# Patient Record
Sex: Male | Born: 1935 | Race: White | Hispanic: No | Marital: Married | Smoking: Former smoker
Health system: Southern US, Community
[De-identification: ages and names within clinical notes are randomized; demographics above are authoritative.]

## PROBLEM LIST (undated history)

## (undated) DIAGNOSIS — K635 Polyp of colon: Secondary | ICD-10-CM

## (undated) DIAGNOSIS — H3530 Unspecified macular degeneration: Secondary | ICD-10-CM

## (undated) DIAGNOSIS — I6529 Occlusion and stenosis of unspecified carotid artery: Secondary | ICD-10-CM

## (undated) DIAGNOSIS — N200 Calculus of kidney: Secondary | ICD-10-CM

## (undated) DIAGNOSIS — G4730 Sleep apnea, unspecified: Secondary | ICD-10-CM

## (undated) DIAGNOSIS — E785 Hyperlipidemia, unspecified: Secondary | ICD-10-CM

## (undated) DIAGNOSIS — N529 Male erectile dysfunction, unspecified: Secondary | ICD-10-CM

## (undated) DIAGNOSIS — M1990 Unspecified osteoarthritis, unspecified site: Secondary | ICD-10-CM

## (undated) DIAGNOSIS — I82409 Acute embolism and thrombosis of unspecified deep veins of unspecified lower extremity: Secondary | ICD-10-CM

## (undated) DIAGNOSIS — N400 Enlarged prostate without lower urinary tract symptoms: Secondary | ICD-10-CM

## (undated) DIAGNOSIS — E669 Obesity, unspecified: Secondary | ICD-10-CM

## (undated) HISTORY — DX: Calculus of kidney: N20.0

## (undated) HISTORY — DX: Acute embolism and thrombosis of unspecified deep veins of unspecified lower extremity: I82.409

## (undated) HISTORY — DX: Obesity, unspecified: E66.9

## (undated) HISTORY — DX: Male erectile dysfunction, unspecified: N52.9

## (undated) HISTORY — DX: Occlusion and stenosis of unspecified carotid artery: I65.29

## (undated) HISTORY — DX: Benign prostatic hyperplasia without lower urinary tract symptoms: N40.0

## (undated) HISTORY — PX: KNEE ARTHROSCOPY, MEDIAL PATELLO FEMORAL LIGAMENT RECONSTRUCTION W/ HAMSTRING GRAFT: SHX1889

## (undated) HISTORY — PX: SPINAL FUSION: SHX223

## (undated) HISTORY — DX: Sleep apnea, unspecified: G47.30

## (undated) HISTORY — DX: Unspecified macular degeneration: H35.30

## (undated) HISTORY — DX: Polyp of colon: K63.5

## (undated) HISTORY — PX: EYE SURGERY: SHX253

---

## 1993-10-24 DIAGNOSIS — I82409 Acute embolism and thrombosis of unspecified deep veins of unspecified lower extremity: Secondary | ICD-10-CM

## 1993-10-24 HISTORY — DX: Acute embolism and thrombosis of unspecified deep veins of unspecified lower extremity: I82.409

## 2000-08-03 ENCOUNTER — Ambulatory Visit (HOSPITAL_COMMUNITY): Admission: RE | Admit: 2000-08-03 | Discharge: 2000-08-03 | Payer: Self-pay | Admitting: Orthopaedic Surgery

## 2002-07-12 ENCOUNTER — Ambulatory Visit (HOSPITAL_BASED_OUTPATIENT_CLINIC_OR_DEPARTMENT_OTHER): Admission: RE | Admit: 2002-07-12 | Discharge: 2002-07-12 | Payer: Self-pay | Admitting: Pulmonary Disease

## 2003-09-11 ENCOUNTER — Encounter (INDEPENDENT_AMBULATORY_CARE_PROVIDER_SITE_OTHER): Payer: Self-pay | Admitting: Specialist

## 2003-09-11 ENCOUNTER — Inpatient Hospital Stay (HOSPITAL_COMMUNITY): Admission: RE | Admit: 2003-09-11 | Discharge: 2003-09-15 | Payer: Self-pay | Admitting: Orthopedic Surgery

## 2004-12-10 ENCOUNTER — Ambulatory Visit (HOSPITAL_COMMUNITY): Admission: RE | Admit: 2004-12-10 | Discharge: 2004-12-10 | Payer: Self-pay | Admitting: Gastroenterology

## 2004-12-10 ENCOUNTER — Encounter (INDEPENDENT_AMBULATORY_CARE_PROVIDER_SITE_OTHER): Payer: Self-pay | Admitting: Specialist

## 2005-03-24 ENCOUNTER — Ambulatory Visit (HOSPITAL_COMMUNITY): Admission: RE | Admit: 2005-03-24 | Discharge: 2005-03-25 | Payer: Self-pay | Admitting: Ophthalmology

## 2006-03-12 ENCOUNTER — Encounter: Admission: RE | Admit: 2006-03-12 | Discharge: 2006-03-12 | Payer: Self-pay | Admitting: Orthopedic Surgery

## 2006-07-06 ENCOUNTER — Inpatient Hospital Stay (HOSPITAL_COMMUNITY): Admission: RE | Admit: 2006-07-06 | Discharge: 2006-07-10 | Payer: Self-pay | Admitting: Orthopedic Surgery

## 2007-04-03 ENCOUNTER — Ambulatory Visit (HOSPITAL_COMMUNITY): Admission: RE | Admit: 2007-04-03 | Discharge: 2007-04-03 | Payer: Self-pay | Admitting: Family Medicine

## 2007-04-03 ENCOUNTER — Ambulatory Visit: Payer: Self-pay | Admitting: Vascular Surgery

## 2007-11-22 ENCOUNTER — Ambulatory Visit (HOSPITAL_COMMUNITY): Admission: RE | Admit: 2007-11-22 | Discharge: 2007-11-23 | Payer: Self-pay | Admitting: Ophthalmology

## 2010-05-10 ENCOUNTER — Emergency Department (HOSPITAL_COMMUNITY): Admission: EM | Admit: 2010-05-10 | Discharge: 2010-05-10 | Payer: Self-pay | Admitting: Emergency Medicine

## 2010-05-18 ENCOUNTER — Encounter: Admission: RE | Admit: 2010-05-18 | Discharge: 2010-05-18 | Payer: Self-pay | Admitting: Urology

## 2010-05-18 ENCOUNTER — Ambulatory Visit (HOSPITAL_COMMUNITY): Admission: RE | Admit: 2010-05-18 | Discharge: 2010-05-18 | Payer: Self-pay | Admitting: Urology

## 2010-10-27 ENCOUNTER — Encounter
Admission: RE | Admit: 2010-10-27 | Discharge: 2010-10-27 | Payer: Self-pay | Source: Home / Self Care | Attending: Family Medicine | Admitting: Family Medicine

## 2011-01-08 LAB — URINALYSIS, ROUTINE W REFLEX MICROSCOPIC
Bilirubin Urine: NEGATIVE
Glucose, UA: NEGATIVE mg/dL
Ketones, ur: NEGATIVE mg/dL
Leukocytes, UA: NEGATIVE
Nitrite: NEGATIVE
Protein, ur: NEGATIVE mg/dL
Specific Gravity, Urine: 1.023 (ref 1.005–1.030)
Urobilinogen, UA: 0.2 mg/dL (ref 0.0–1.0)
pH: 5 (ref 5.0–8.0)

## 2011-01-08 LAB — BASIC METABOLIC PANEL
BUN: 22 mg/dL (ref 6–23)
BUN: 32 mg/dL — ABNORMAL HIGH (ref 6–23)
CO2: 23 mEq/L (ref 19–32)
CO2: 31 mEq/L (ref 19–32)
Calcium: 8.4 mg/dL (ref 8.4–10.5)
Calcium: 8.9 mg/dL (ref 8.4–10.5)
Chloride: 105 mEq/L (ref 96–112)
Chloride: 102 mEq/L (ref 96–112)
Creatinine, Ser: 1.48 mg/dL (ref 0.4–1.5)
Creatinine, Ser: 2.35 mg/dL — ABNORMAL HIGH (ref 0.4–1.5)
GFR calc Af Amer: 57 mL/min — ABNORMAL LOW (ref >60)
GFR calc Af Amer: 33 mL/min — ABNORMAL LOW (ref >60)
GFR calc non Af Amer: 47 mL/min — ABNORMAL LOW (ref >60)
GFR calc non Af Amer: 27 mL/min — ABNORMAL LOW (ref >60)
Glucose, Bld: 175 mg/dL — ABNORMAL HIGH (ref 70–99)
Glucose, Bld: 116 mg/dL — ABNORMAL HIGH (ref 70–99)
Potassium: 3.9 mEq/L (ref 3.5–5.1)
Potassium: 4.7 mEq/L (ref 3.5–5.1)
Sodium: 136 mEq/L (ref 135–145)
Sodium: 140 mEq/L (ref 135–145)

## 2011-01-08 LAB — CBC
HCT: 46 % (ref 39.0–52.0)
Hemoglobin: 15.8 g/dL (ref 13.0–17.0)
MCH: 30.6 pg (ref 26.0–34.0)
MCHC: 34.4 g/dL (ref 30.0–36.0)
MCV: 89.2 fL (ref 78.0–100.0)
Platelets: 110 10*3/uL — ABNORMAL LOW (ref 150–400)
RBC: 5.16 MIL/uL (ref 4.22–5.81)
RDW: 13.2 % (ref 11.5–15.5)
WBC: 10.1 10*3/uL (ref 4.0–10.5)

## 2011-01-08 LAB — DIFFERENTIAL
Basophils Absolute: 0 10*3/uL (ref 0.0–0.1)
Basophils Relative: 0 % (ref 0–1)
Eosinophils Absolute: 0.1 10*3/uL (ref 0.0–0.7)
Eosinophils Relative: 1 % (ref 0–5)
Lymphocytes Relative: 7 % — ABNORMAL LOW (ref 12–46)
Lymphs Abs: 0.7 10*3/uL (ref 0.7–4.0)
Monocytes Absolute: 0.7 10*3/uL (ref 0.1–1.0)
Monocytes Relative: 7 % (ref 3–12)
Neutro Abs: 8.6 10*3/uL — ABNORMAL HIGH (ref 1.7–7.7)
Neutrophils Relative %: 85 % — ABNORMAL HIGH (ref 43–77)

## 2011-01-08 LAB — URINE MICROSCOPIC-ADD ON

## 2011-03-08 NOTE — Op Note (Signed)
[name], Ian Rhodes              ACCOUNT NO.:  [id number]   MEDICAL RECORD NO.:  [id number]          PATIENT TYPE:  AMB   LOCATION:  SDS                          FACILITY:  [facility]   PHYSICIAN:  John D. Zigmund Daniel, M.D. DATE OF BIRTH:  May 16, 1936   DATE OF PROCEDURE:  11/22/2007  DATE OF DISCHARGE:                               OPERATIVE REPORT   ADMISSION DIAGNOSIS:  Recurrent macular hole left eye.   PROCEDURES:  1. Pars plana vitrectomy.  2. Retinal photocoagulation.  3. Membrane peel.  4. Serum patch.  5. Gas fluid exchange in the left eye.   SURGEON:  Chrystie Nose. Zigmund Daniel, M.D.   ASSISTANT:  Deatra Ina, MA   ANESTHESIA:  General.   DETAILS:  Usual prep and drape.  The indirect ophthalmoscope laser was  moved into place, 658 burns were placed around the retinal periphery  with the power of 360 milliwatts, 1000 microns each and 0.1 seconds  each.   Attention was carried to the pars plana area where sclerotomies were  made at 10, 2, and 4 o'clock.  The infusion port anchored into place at  4 o'clock.  The lighted pick and the cutter placed at 10 and 2 o'clock  respectively.  Contact lens ring anchored into place at 6 and 12  o'clock.  Methylcellulose placed on the corneal surface, and the flat  contact lens was placed.  The pars plana vitrectomy was begun just  behind the pseudophakos.  Wisps of vitreous were encountered, and  carefully removed under low suction and rapid cutting down to the  vitreous base for 360 degrees.   Attention was carried to the macular region where the macular hole was  apparent.  Blood was covering the disk area, and this was removed with a  [place] [employer] brush.  The diamond dusted membrane scraper, the Eagle  pick and ILM forceps were used to engage the ILM and remove it from its  attachment to the macular hole.  The 25-gauge cutter was opened to trim  the ILM off of the edge of the macular hole.  Once all traction was  released from the  edge of the hole, a total gas fluid exchange was  performed.  Additional time was allowed to pass, so that fluid tracked  down the walls of the eye, and collected in the posterior segment.  The  [place] [employer] brush was used to dry the retina and remove all  vitreous fluid.  Serum patch was delivered.  Perfluoron propane, 16%  concentration, was exchanged for intravitreal gas.   The instruments were removed from the eye, and 9-0 nylon was used to  close the sclerotomy sites.  They were tested and found to be tight.  The conjunctiva was closed with wet-field cautery.  Polymyxin and  gentamicin were irrigated into tenon's space.  Decadron 10 mg was  injected into the lower subconjunctival space.  Marcaine was injected  around the globe for postop pain.  The closing pressure was 10 with a  Baer keratometer.   COMPLICATIONS:  None.   DURATION:  1 hour.   TobraDex, a  patch and shield were placed.  The patient was awakened and  taken to recovery in satisfactory condition.      Chrystie Nose. Zigmund Daniel, M.D.  Electronically Signed     JDM/MEDQ  D:  11/22/2007  T:  11/22/2007  Job:  [id number]

## 2011-03-11 NOTE — Op Note (Signed)
NAME:  Ian Rhodes, Ian Rhodes NO.:  [id number]   MEDICAL RECORD NO.:  [id number]                   PATIENT TYPE:  INP   LOCATION:  2895                                 FACILITY:  [facility]   PHYSICIAN:  Newt Minion, M.D.                DATE OF BIRTH:  1936-05-07   DATE OF PROCEDURE:  09/11/2003  DATE OF DISCHARGE:                                 OPERATIVE REPORT   PREOPERATIVE DIAGNOSIS:  1. Osteoarthritis of the right knee.  2. Cyst, right posterior shoulder.   POSTOPERATIVE DIAGNOSIS:  1. Osteoarthritis of the right knee.  2. Cyst, right posterior shoulder.   OPERATION PERFORMED:  Right total knee arthroplasty with Osteonics Scorpio  components, #11 femur, #9 tibia, 10 mm poly tray with a #9 patella and  excision of cyst.   SURGEON:  Newt Minion, M.D.   ANESTHESIA:  General.   ESTIMATED BLOOD LOSS:  Minimal.   ANTIBIOTICS:  1g Kefzol.   TOURNIQUET TIME:  101 minutes at 341mmHg.   DISPOSITION:  To post anesthesia care unit in stable condition.   INDICATIONS FOR PROCEDURE:  The patient is a 75 year old gentleman who is  status post open meniscectomies with degenerative collapse of his right knee  and presents at this time for right total knee arthroplasty.  The patient  states he has failed conservative care and wishes to proceed with total knee  replacement at this time.  The risks and benefits were discussed including  infection, neurovascular injury, persistent pain, need for additional  surgery, DVT, pulmonary embolus.  The patient states he understands and  wishes to proceed at this time.   DESCRIPTION OF PROCEDURE:  The patient was brought to the operating room 1  and underwent a general anesthetic.  After adequate level of anesthesia  obtained, the patient's right lower extremity was prepped using DuraPrep and  draped into a sterile field.  An Charlie Pitter was used to cover all exposed skin.  The leg was elevated and tourniquet inflated to  350 mmHg.  The midline  incision was made just medial to midline due to his previous lateral open  incision.  This was carried down through the medial retinaculum.  The joint  was exposed.  Attempted eversion of the patella was unsuccessful due to the  large osteophytic bone spurs of the patella and scarring over the lateral  capsule.  The patellar resurfacing was then performed first to allow the  patella to evert.  This was performed and 10 mm of the patella was removed.  Attention was then focused on the femur.  The drill was made and the  guidewire inserted up the femur for a 5 degree valgus.  12 mm was taken off  the distal femur.  The cutting block was sized for 11 and the Chamfer cuts  were made for the 11 femoral component.  Attention was then focused on the  tibia.  The McHale retractor was placed and the 4 mm stylus was used and an  additional 4 mm was taken off the tibia.  This had a stable balance with the  collateral ligaments and the posterior capsule was then released due to  adhesions and tightness in the posterior capsule.  The Chamfer block was  then used for the femur and the Chamfers were made for the posterior  stabilized femoral component.  The femoral trial and the tibial trial were  placed with a 10 mm posterior stabilized component.  This had stable varus-  valgus.  He was at extension to full extension.  Previous contraction was 10  to 20 degrees of flexion contraction and the patient had flexion to 110  degrees without problem.  The patient's tibial tray was then marked with  rotation and external alignment was checked and the keel cuts were then made  on the tibia.  The patella was again checked and resurfaced and the peg  holes were made for the 9 mm poly.  The wound was irrigated with pulse  lavage.  The cement was mixed and the tibial and femoral component were  cemented in place with a #9 tibial component, #11 femoral component and a 10  mm poly tray.  The  poly tray was placed.  The knee was held in extension,  loose cement was removed and the patellar component was also cemented and  loose cement was removed.  The knee was held in extension while the cement  cured.  Pulse lavage was used during this time and further debridement of  loose cement was removed.  The patient's knee was then placed through a  range of motion. There was subluxation of the patella and a lateral release  was performed.  The patella tracked midline after lateral release. The  tourniquet was deflated after 101 minutes.  Hemostasis was obtained and  closure of the wound was then performed. The capsule was closed using #1  Vicryl.  Subcutaneous tissue was closed using 0 Vicryl, subcu was closed  using 2-0 Vicryl.  The skin was closed using Proximate staples.  The wound  was covered with Adaptic orthopedic sponges, ABD dressing, Webril and a  Coban dressing.  Attention was then focused on the sebaceous cyst on his  right posterior shoulder.  New sterile instruments were used.  New gloves  were also used, and an incision was made horizontally which extended through  his previous incision over the sebaceous cyst in this area.  There was white  cheesy material which came out of the cyst which was consistent with a  recurrent sebaceous cyst.  This was sent to pathology for identification.  The wound was closed with Proximate staples.  The wound was covered with  Adaptic, orthopedic sponges and HypaFix tape.  The patient was transferred  supine, extubated and taken to PACU in stable condition.                                               Newt Minion, M.D.    MVD/MEDQ  D:  09/11/2003  T:  09/12/2003  Job:  [id number]

## 2011-03-11 NOTE — Discharge Summary (Signed)
NAMEAHSIR, SIMA NO.:  [id number]   MEDICAL RECORD NO.:  [id number]          PATIENT TYPE:  INP   LOCATION:  [id number]                         FACILITY:  [facility]   PHYSICIAN:  Newt Minion, MD     DATE OF BIRTH:  12/12/1935   DATE OF ADMISSION:  07/06/2006  DATE OF DISCHARGE:  07/10/2006                                 DISCHARGE SUMMARY   DIAGNOSIS:  Osteoarthritis, left knee.   PROCEDURE:  Left total knee arthroplasty.   DISPOSITION:  Discharged to home in stable condition with home health  physical therapy with follow-up in the office in two weeks.   HISTORY OF PRESENT ILLNESS:  The patient is a 75 year old gentleman with  osteoarthritis of his left knee.  The patient has full failed conservative  care and presents at this time for total knee arthroplasty.  The risks and  benefits of the surgery were discussed.  The patient states he understands  and wishes to proceed.   HOSPITAL COURSE:  The patient's hospital course was essentially  unremarkable.  He underwent a left total knee arthroplasty with DePuy  components on July 06, 2006.  He was started on Kefzol for 24 hours for  infection prophylaxis and was started on Coumadin for DVT prophylaxis.  The  patient progressed well with physical therapy and was felt to be safe for  discharge to home and he was discharged to home in stable condition on  September 17 with [facility] for home health physical therapy,  prescription for Tylox and Coumadin, and follow-up in the office one week  after discharge.      Newt Minion, MD  Electronically Signed     MVD/MEDQ  D:  08/01/2006  T:  08/02/2006  Job:  [id number]

## 2011-03-11 NOTE — Op Note (Signed)
Ian Rhodes, Ian Rhodes              ACCOUNT NO.:  [id number]   MEDICAL RECORD NO.:  [id number]          PATIENT TYPE:  AMB   LOCATION:  ENDO                         FACILITY:  [facility]   PHYSICIAN:  John C. Amedeo Plenty, M.D.    DATE OF BIRTH:  1935-12-02   DATE OF PROCEDURE:  12/10/2004  DATE OF DISCHARGE:                                 OPERATIVE REPORT   PROCEDURE:  Colonoscopy.   INDICATIONS FOR PROCEDURE:  Average-risk colon cancer screening in a 75-year-  old patient with no previous screening.   PROCEDURE:  The patient was placed in the left lateral decubitus position  and placed on the pulse monitor with continuous low-flow oxygen delivered by  nasal cannula.  He was sedated with 60 mcg IV fentanyl and 6 mg IV Versed.  The Olympus video colonoscope was inserted into the rectum and advanced to  the ileocolonic anastomosis that was in the right lower quadrant.  He had  had previous colonic surgery for some sort of obstruction.  The anastomosis  appeared intact, as did the colon distal to it, which appeared to possibly  the ascending colon.  The presumed transverse colon also appeared normal.  At approximately 50 cm in the descending colon, there was an 8-mm sessile  polyp that was removed by snare.  Within the sigmoid colon, there were two  more small sessile polyps that were fulgurated by hot biopsy.  A fourth  polyp at the rectosigmoid junction approximately 6 mm in diameter was also  fulgurated by hot biopsy.  No diverticula or other mucosal abnormalities  were seen.  The rectum appeared normal.  Retroflexed view of the anus revealed no  obvious internal hemorrhoids.  The scope was then withdrawn and the patient returned to the recovery room  in stable condition.  He tolerated the procedure well, and there were no  immediate complications.   IMPRESSION:  1.  Descending sigmoid and rectosigmoid colon polyps.  2.  Evidence of previous proximal colonic surgery with intact  anastomosis.   PLAN:  Await histology to determine method and interval for future colon  screening      JCH/MEDQ  D:  12/10/2004  T:  12/10/2004  Job:  [id number]   cc:   Frann Rider, M.D.  [place]  [place] [address]  Fax: [phone]

## 2011-03-11 NOTE — Op Note (Signed)
Ian Rhodes, Ian Rhodes NO.:  [id number]   MEDICAL RECORD NO.:  [id number]          PATIENT TYPE:  INP   LOCATION:  2899                         FACILITY:  [facility]   PHYSICIAN:  Newt Minion, MD     DATE OF BIRTH:  [birth year]   DATE OF PROCEDURE:  07/06/2006  DATE OF DISCHARGE:                                 OPERATIVE REPORT   PREOPERATIVE DIAGNOSIS:  Osteoarthritis, left knee.   POSTOPERATIVE DIAGNOSIS:  Osteoarthritis, left knee.   PROCEDURE:  Left total knee arthroplasty with DePuy components, rotating  platform with a #4 femur, #5 tibia, 41-mm patella with a 12.5-mm posterior  stabilized poly tray.   SURGEON:  Newt Minion, MD   ANESTHESIA:  General.   ESTIMATED BLOOD LOSS:  Minimal.   ANTIBIOTICS:  1 gram of Kefzol.   DRAINS:  None.   COMPLICATIONS:  None.   TOURNIQUET TIME:  30 minutes at 300 mmHg at the thigh.   DISPOSITION:  To PACU in stable condition.   INDICATIONS FOR PROCEDURE:  The patient is a 75 year old gentleman who  presents with chronic osteoarthritis of the left knee.  Has failed  conservative care, has pain with activities of daily living and wished to  proceed with a total knee arthroplasty.  Risks and benefits were discussed  including infection, neurovascular injury, persistent pain, DVT, pulmonary  embolus.  The patient states he understands and wished proceed at this time.   DESCRIPTION OF PROCEDURE:  The patient is brought to OR room 4 and underwent  general anesthetic.  After adequate level of anesthesia obtained, the  patient's left lower extremity was prepped using DuraPrep and draped into a  sterile field.  A midline incision was made.  This was carried down to a  medial parapatellar retinacular incision.  The patella was everted.  The  starting hole was made in the femur.  The IM guide was used with 5 degrees  valgus and this was set to take 11 mm off the distal femur.  The sizing  block was then used.  This sized  for size 4 and the anterior and posterior  chamfers were made for a size 4 femur.  Attention was then focused on the  tibia.  External alignment guide was used with neutral varus-valgus, neutral  posterior slope and this was set to take 10 mm off the medial femoral  plateau.  This cut was made and then the keel cuts were then made for the  size five tibia.  The box cut was then used on the femur and the box cuts  were made for the femur.  The trial femoral and tibial components were  placed and this was tried with a 10 and 12.5-mm poly tray.  The poly tray  12.5 had full extension and good varus and valgus stability.  The trial  components were removed.  The punch holes were made for the femur and  attention was focused on the patella.  The patella was resurfaced and this  was measured for a size 41 patella and the drill holes were made for  the  patella pegs.  The knee was then irrigated with normal saline.  The meniscal  tissue was excised.  Large bony loose bodies were also excised.  This was  cleansed with normal saline with pulse lavage.  The tibial and femoral  components were then cemented in place.  This was then again irrigated with  normal saline.  Loose cement was removed and the knee was placed in  extension with a 12.5-mm poly tray.  The patella was then cemented in place  and a clamp was left in place.  The knee was left extended until the cement  had hardened.  The pulse lavage was again used to irrigate the knee.  The  clamps were removed and the knee was placed through full range of motion.  The patella tracked midline.  The tourniquet was deflated after total of 30  minutes.  Hemostasis was obtained.  The retinacular incision was closed  using a #1 Vicryl.  Subcu was closed using 2-0 Vicryl.  The skin was closed  using Proximate staples.  The wound was covered with Adaptic orthopedic  sponges, ABD dressing, Webril and a Coban dressing.  The patient was  extubated and taken  to PACU in stable condition.      Newt Minion, MD  Electronically Signed     MVD/MEDQ  D:  07/06/2006  T:  07/07/2006  Job:  [id number]

## 2011-03-11 NOTE — H&P (Signed)
NAME:  Ian Rhodes, Ian Rhodes NO.:  [id number]   MEDICAL RECORD NO.:  [id number]                   PATIENT TYPE:  INP   LOCATION:  2895                                 FACILITY:  [facility]   PHYSICIAN:  Newt Minion, M.D.                DATE OF BIRTH:  [birth year]   DATE OF ADMISSION:  09/11/2003  DATE OF DISCHARGE:                                HISTORY & PHYSICAL   HISTORY OF PRESENT ILLNESS:  The patient is a 75 year old gentleman with  degenerative osteoarthritis of both knees with complaints of stiffness,  decreased flexion, decreased extension, and giving way. The patient has had  chronic knee pain. He is status post arthroscopy of the right knee with an  open procedure in 1995 and presents at this time for total knee  arthroplasty.   ALLERGIES:  No known drug allergies.   MEDICATIONS:  No daily medications.   PAST SURGICAL HISTORY:  1. Positive for herniorrhaphy in 1963.  2. Intestinal surgery in 1968.  3. Arthroscopy in 1970 and 1995.  4. Spinal fusion 1985.  5. Urinary surgery 1992.   PAST MEDICAL HISTORY:  The patient has a history of DVT of the right lower  extremity x2.   SOCIAL HISTORY:  Negative for tobacco since 1995. Negative for alcohol. He  is married.   FAMILY HISTORY:  Noncontributory.   REVIEW OF SYSTEMS:  Positive for arthritis, hernia, history of DVT secondary  to his knee scope.   PHYSICAL EXAMINATION:  VITAL SIGNS:  Temperature 97.6, pulse 60, respiratory  rate 16, blood pressure 124/78.  GENERAL:  In general, he is no acute distress.  LUNGS:  Clear to auscultation.  CARDIOVASCULAR:  Regular rate and rhythm.  NECK:  Supple, no bruits.  EXTREMITIES:  Examination of his posterior right shoulder:  He does have a  large sebaceous cyst. Examination of the right knee:  He has range of motion  from 10 to 120 degrees with neutral varus and valgus alignment. Radiograph  shows tricompartmental osteoarthritis.   ASSESSMENT:   Tricompartmental osteoarthritis, right knee.   PLAN:  The patient is scheduled for right total knee arthroplasty at this  time. The risks and benefits were discussed including infection,  neurovascular injury, persistent pain, decreased range of motion, DVT,  pulmonary embolus. The patient states he understands and wishes to proceed  at this time.                                                Newt Minion, M.D.    MVD/MEDQ  D:  09/11/2003  T:  09/11/2003  Job:  [id number]

## 2011-03-11 NOTE — Op Note (Signed)
NAMEGAYLAND, SIVERLING              ACCOUNT NO.:  [id number]   MEDICAL RECORD NO.:  [id number]          PATIENT TYPE:  OIB   LOCATION:  5729                         FACILITY:  [facility]   PHYSICIAN:  Chrystie Nose. Zigmund Daniel, M.D. DATE OF BIRTH:  [birth year]   DATE OF PROCEDURE:  03/24/2005  DATE OF DISCHARGE:                                 OPERATIVE REPORT   ADMISSION DIAGNOSIS:  Macular macular hole in the left eye.   PROCEDURES:  Retinal photocoagulation, left eye; pars plana vitrectomy, left  eye; membrane peel, left eye; serum patch, left eye; perfluoropropane  injection, left eye.   SURGEON:  Tempie Hoist, MD   ASSISTANT:  Deatra Ina, MA   ANESTHESIA:  General.   DETAILS:  Usual prep and drape, peritomies at 10, 2 and 4, infusion at 4  o'clock.  The lighted pick and the cutter were placed at 10 and 2 o'clock  respectively, contact lens ring anchored into place at 6 and 12 o'clock.  The methylcellulose was placed on the cornea and the flat contact lens was  placed.  The pars plana vitrectomy was begun just behind the crystalline  lens where strands of vitreous were encountered; these were carefully  removed under low suction and rapid cutting.  The vitrectomy was carried  down to the macular surface where additional membranes were encountered.  The silicone-tipped suction line was drawn down to the macular surface and a  fish strike sign occurred twice.  The posterior membranes were stripped up  with a lighted pick and the vitreous cutter, and the silicone-tipped suction  line.  These were removed from with the reinsertion of the vitreous cutter  and vitrectomy removal.  Once all membranes were removed, the indirect  ophthalmoscope was moved into place ; 797 burns were placed around the  retinal periphery with a power between 300 and 600 milliwatts, 1000 microns  each and 0.1 seconds each.  The diamond-dusted membrane scraper was used to  remove membranes from around the edge of the  macular hole.  The Alleman brush was then used for a gas-fluid exchange.  A total gas fill  of the vitreous cavity was obtained.  Sufficient time was allowed for  additional fluid to track down the walls of the eye and collect in the  posterior pole; during this time, the perfluoropropane was mixed to 17% and  the serum patch was prepared.  Additional fluid was removed with a [place]  [employer] brush, a serum patch was delivered, the perfluoropropane was  exchanged for intravitreal gas, the instruments were removed from the eye  and 9-0 nylon was used to close the sclerotomy sites.  The conjunctiva was  closed with wet-field cautery.  Polymyxin and gentamicin were irrigated into  tenon space,  Decadron 10 mg was injected to the lower subconjunctival  space.  Marcaine was injected around the globe for postop pain.  Additional  perfluoropropane gas was injected to a closing pressure of 15 mmHg with a  Baer keratometer.  The wet-field cautery was used to close the conjunctiva.  TobraDex ophthalmic ointment, a patch and shield  were placed.  The patient  was awakened and taken to Recovery in satisfactory condition.   COMPLICATIONS:  None.   DURATION:  One and a half hours.      JDM/MEDQ  D:  03/24/2005  T:  03/25/2005  Job:  [id number]

## 2011-03-11 NOTE — Discharge Summary (Signed)
NAME:  Ian Rhodes, KUSNER NO.:  [id number]   MEDICAL RECORD NO.:  [id number]                   PATIENT TYPE:  INP   LOCATION:  5006                                 FACILITY:  [facility]   PHYSICIAN:  Newt Minion, M.D.                DATE OF BIRTH:  18-May-1936   DATE OF ADMISSION:  09/11/2003  DATE OF DISCHARGE:  09/15/2003                                 DISCHARGE SUMMARY   DIAGNOSIS:  Osteoarthritis right hip.   PROCEDURE:  Right total hip arthroplasty.   DISPOSITION:  Discharged to home in stable condition with Home Health  physical therapy and home CPM.   FOLLOWUP:  In the office in two weeks.   HISTORY OF PRESENT ILLNESS:  The patient is a 75 year old gentleman with  bilateral knee osteoarthritis.  The patient has progressed to the point  where he is unable to perform activities of daily living due to his right  knee pain.  He presents at this time after failing conservative care for  right total hip arthroplasty.   HOSPITAL COURSE:  Essentially unremarkable.  He underwent a right total knee  arthroplasty on November 18, with Osteonic Scorpio components, cemented #11  femur, #9 tibia, 10 mm poly tray with a #9 patella.  The patient also had a  cyst mid scapular border, and this was also excised.  The patient was  treated with Kefzol for infection prophylaxis, and Coumadin for DVT  prophylaxis.  The patient progressed well with physical therapy.  He  continued to have stiffness in his knee and required continued CPM for range  of motion of his knee.  His vital signs were stable.  He was discharged to  home in stable condition on November 22 with Coumadin for continued DVT  prophylaxis, Vicodin for pain, Home Health physical therapy and CPM.   FOLLOWUP:  He will follow up in the office in two weeks.                                                Newt Minion, M.D.    MVD/MEDQ  D:  10/17/2003  T:  10/19/2003  Job:  [id number]

## 2011-07-14 LAB — CBC
HCT: 44.3
Hemoglobin: 14.8
MCHC: 33.3
MCV: 85.8
Platelets: 143 — ABNORMAL LOW
RBC: 5.17
RDW: 14.5
WBC: 5.4

## 2011-07-14 LAB — BASIC METABOLIC PANEL
BUN: 14
CO2: 30
Calcium: 8.9
Chloride: 105
Creatinine, Ser: 1.21
GFR calc Af Amer: >60
GFR calc non Af Amer: 59 — ABNORMAL LOW
Glucose, Bld: 123 — ABNORMAL HIGH
Potassium: 4.3
Sodium: 142

## 2011-07-14 LAB — AUTOLOGOUS SERUM PATCH PREP

## 2011-09-05 ENCOUNTER — Other Ambulatory Visit (HOSPITAL_COMMUNITY): Payer: Self-pay | Admitting: Orthopedic Surgery

## 2011-09-14 ENCOUNTER — Encounter (HOSPITAL_COMMUNITY)
Admission: RE | Admit: 2011-09-14 | Discharge: 2011-09-14 | Disposition: A | Discharge status: Home / Self Care | Payer: Medicare Other | Source: Ambulatory Visit | Attending: Orthopedic Surgery | Admitting: Orthopedic Surgery

## 2011-09-14 ENCOUNTER — Other Ambulatory Visit: Payer: Self-pay

## 2011-09-14 ENCOUNTER — Encounter (HOSPITAL_COMMUNITY): Payer: Self-pay

## 2011-09-14 HISTORY — DX: Hyperlipidemia, unspecified: E78.5

## 2011-09-14 LAB — SURGICAL PCR SCREEN
MRSA, PCR: NEGATIVE
Staphylococcus aureus: POSITIVE — AB

## 2011-09-14 LAB — COMPREHENSIVE METABOLIC PANEL
ALT: 18 U/L (ref 0–53)
Albumin: 4.1 g/dL (ref 3.5–5.2)
Alkaline Phosphatase: 46 U/L (ref 39–117)
Calcium: 9.5 mg/dL (ref 8.4–10.5)
Potassium: 4.9 mEq/L (ref 3.5–5.1)
Sodium: 139 mEq/L (ref 135–145)
Total Protein: 6.6 g/dL (ref 6.0–8.3)

## 2011-09-14 LAB — APTT: aPTT: 37 seconds (ref 24–37)

## 2011-09-14 LAB — CBC
MCHC: 34.6 g/dL (ref 30.0–36.0)
RDW: 14 % (ref 11.5–15.5)

## 2011-09-14 LAB — PROTIME-INR: Prothrombin Time: 12.9 seconds (ref 11.6–15.2)

## 2011-09-14 NOTE — Pre-Procedure Instructions (Signed)
[place] III  09/14/2011   Your procedure is scheduled on:  Nov. 28, 2012  Report to [place] at [place].  Call this number if you have problems the morning of surgery: [phone]   Remember:   Do not eat food:4 Hours before arrival.  Do not drink clear liquids: 4 Hours before arrival.  Take these medicines the morning of surgery with A SIP OF WATER: none   Do not wear jewelry, make-up or nail polish.  Do not wear lotions, powders, or perfumes. You may wear deodorant.  Do not shave 48 hours prior to surgery.  Do not bring valuables to the hospital.  Contacts, dentures or bridgework may not be worn into surgery.  Leave suitcase in the car. After surgery it may be brought to your room.  For patients admitted to the hospital, checkout time is 11:00 AM the day of discharge.   Patients discharged the day of surgery will not be allowed to drive home.  Name and phone number of your driver: na  Special Instructions: CHG Shower Use Special Wash: 1/2 bottle night before surgery and 1/2 bottle morning of surgery.   Please read over the following fact sheets that you were given: Pain Booklet, Blood Transfusion Information and Surgical Site Infection Prevention

## 2011-09-14 NOTE — Pre-Procedure Instructions (Signed)
[place] III  09/14/2011   Your procedure is scheduled on Sep 21, 2011 Report to [place] at [place].  Call this number if you have problems the morning of surgery: [phone]   Remember:   Do not eat food:After Midnight.  Do not drink clear liquids: 4 Hours before arrival.  Take these medicines the morning of surgery with A SIP OF WATER: none   Do not wear jewelry, make-up or nail polish.  Do not wear lotions, powders, or perfumes. You may wear deodorant.  Do not shave 48 hours prior to surgery.  Do not bring valuables to the hospital.  Contacts, dentures or bridgework may not be worn into surgery.  Leave suitcase in the car. After surgery it may be brought to your room.  For patients admitted to the hospital, checkout time is 11:00 AM the day of discharge.   Patients discharged the day of surgery will not be allowed to drive home.  Name and phone number of your driver: na  Special Instructions: CHG Shower Use Special Wash: 1/2 bottle night before surgery and 1/2 bottle morning of surgery.   Please read over the following fact sheets that you were given: Pain Booklet, Blood Transfusion Information and Surgical Site Infection Prevention

## 2011-09-20 MED ORDER — CEFAZOLIN SODIUM-DEXTROSE 2-3 GM-% IV SOLR
2.0000 g | INTRAVENOUS | Status: AC
  Administered 2011-09-21: 2 g via INTRAVENOUS
  Filled 2011-09-20: qty 50

## 2011-09-21 ENCOUNTER — Inpatient Hospital Stay (HOSPITAL_COMMUNITY): Payer: Medicare Other | Admitting: Anesthesiology

## 2011-09-21 ENCOUNTER — Encounter (HOSPITAL_COMMUNITY): Payer: Self-pay | Admitting: Anesthesiology

## 2011-09-21 ENCOUNTER — Inpatient Hospital Stay (HOSPITAL_COMMUNITY)
Admission: RE | Admit: 2011-09-21 | Discharge: 2011-09-24 | DRG: 470 | Disposition: A | Discharge status: Home Health | Payer: Medicare Other | Source: Ambulatory Visit | Attending: Orthopedic Surgery | Admitting: Orthopedic Surgery

## 2011-09-21 ENCOUNTER — Encounter (HOSPITAL_COMMUNITY)
Admission: RE | Disposition: A | Discharge status: Home Health | Payer: Self-pay | Source: Ambulatory Visit | Attending: Orthopedic Surgery

## 2011-09-21 ENCOUNTER — Encounter (HOSPITAL_COMMUNITY): Payer: Self-pay | Admitting: Surgery

## 2011-09-21 ENCOUNTER — Inpatient Hospital Stay (HOSPITAL_COMMUNITY): Payer: Medicare Other

## 2011-09-21 DIAGNOSIS — Z7982 Long term (current) use of aspirin: Secondary | ICD-10-CM

## 2011-09-21 DIAGNOSIS — E785 Hyperlipidemia, unspecified: Secondary | ICD-10-CM | POA: Diagnosis present

## 2011-09-21 DIAGNOSIS — E119 Type 2 diabetes mellitus without complications: Secondary | ICD-10-CM | POA: Diagnosis present

## 2011-09-21 DIAGNOSIS — Z87891 Personal history of nicotine dependence: Secondary | ICD-10-CM

## 2011-09-21 DIAGNOSIS — Z96649 Presence of unspecified artificial hip joint: Secondary | ICD-10-CM

## 2011-09-21 DIAGNOSIS — Z7901 Long term (current) use of anticoagulants: Secondary | ICD-10-CM

## 2011-09-21 DIAGNOSIS — Z981 Arthrodesis status: Secondary | ICD-10-CM

## 2011-09-21 DIAGNOSIS — Z01812 Encounter for preprocedural laboratory examination: Secondary | ICD-10-CM

## 2011-09-21 DIAGNOSIS — M169 Osteoarthritis of hip, unspecified: Principal | ICD-10-CM | POA: Diagnosis present

## 2011-09-21 DIAGNOSIS — Z79899 Other long term (current) drug therapy: Secondary | ICD-10-CM

## 2011-09-21 DIAGNOSIS — M1610 Unilateral primary osteoarthritis, unspecified hip: Principal | ICD-10-CM | POA: Diagnosis present

## 2011-09-21 HISTORY — PX: TOTAL HIP ARTHROPLASTY: SHX124

## 2011-09-21 LAB — TYPE AND SCREEN: ABO/RH(D): A POS

## 2011-09-21 LAB — GLUCOSE, CAPILLARY
Glucose-Capillary: 138 mg/dL — ABNORMAL HIGH (ref 70–99)
Glucose-Capillary: 135 mg/dL — ABNORMAL HIGH (ref 70–99)

## 2011-09-21 LAB — ABO/RH: ABO/RH(D): A POS

## 2011-09-21 SURGERY — ARTHROPLASTY, HIP, TOTAL,POSTERIOR APPROACH
Anesthesia: General | Site: Hip | Laterality: Right | Wound class: Clean

## 2011-09-21 MED ORDER — FERROUS SULFATE 325 (65 FE) MG PO TABS
325.0000 mg | ORAL_TABLET | Freq: Three times a day (TID) | ORAL | Status: DC
  Administered 2011-09-21 – 2011-09-24 (×8): 325 mg via ORAL
  Filled 2011-09-21 (×13): qty 1

## 2011-09-21 MED ORDER — OXYCODONE-ACETAMINOPHEN 5-325 MG PO TABS
1.0000 | ORAL_TABLET | ORAL | Status: DC | PRN
  Administered 2011-09-22 – 2011-09-23 (×6): 2 via ORAL
  Filled 2011-09-21 (×4): qty 2
  Filled 2011-09-21: qty 1
  Filled 2011-09-21 (×2): qty 2

## 2011-09-21 MED ORDER — THERA M PLUS PO TABS
1.0000 | ORAL_TABLET | Freq: Every day | ORAL | Status: DC
  Administered 2011-09-21 – 2011-09-24 (×4): 1 via ORAL
  Filled 2011-09-21 (×4): qty 1

## 2011-09-21 MED ORDER — HETASTARCH-ELECTROLYTES 6 % IV SOLN: INTRAVENOUS | Status: DC

## 2011-09-21 MED ORDER — DIPHENHYDRAMINE HCL 12.5 MG/5ML PO ELIX
12.5000 mg | ORAL_SOLUTION | ORAL | Status: DC | PRN
  Filled 2011-09-21: qty 10

## 2011-09-21 MED ORDER — LACTATED RINGERS IV SOLN
1000.0000 mL | INTRAVENOUS | Status: DC
  Administered 2011-09-21: 08:00:00 via INTRAVENOUS

## 2011-09-21 MED ORDER — SIMVASTATIN 20 MG PO TABS
20.0000 mg | ORAL_TABLET | Freq: Every day | ORAL | Status: DC
  Administered 2011-09-21 – 2011-09-23 (×3): 20 mg via ORAL
  Filled 2011-09-21 (×4): qty 1

## 2011-09-21 MED ORDER — WARFARIN VIDEO: Freq: Once | Status: DC

## 2011-09-21 MED ORDER — MAGNESIUM HYDROXIDE 400 MG/5ML PO SUSP
30.0000 mL | Freq: Two times a day (BID) | ORAL | Status: DC | PRN
  Filled 2011-09-21: qty 30

## 2011-09-21 MED ORDER — SODIUM CHLORIDE 0.9 % IV SOLN
INTRAVENOUS | Status: DC
  Administered 2011-09-21: 30 mL/h via INTRAVENOUS

## 2011-09-21 MED ORDER — ACETAMINOPHEN 650 MG RE SUPP: 650.0000 mg | Freq: Four times a day (QID) | RECTAL | Status: DC | PRN

## 2011-09-21 MED ORDER — ASPIRIN 81 MG PO CHEW
81.0000 mg | CHEWABLE_TABLET | Freq: Every day | ORAL | Status: DC
  Administered 2011-09-21 – 2011-09-24 (×4): 81 mg via ORAL
  Filled 2011-09-21 (×4): qty 1

## 2011-09-21 MED ORDER — PATIENT'S GUIDE TO USING COUMADIN BOOK
Freq: Once | Status: AC
  Administered 2011-09-21: 15:00:00
  Filled 2011-09-21: qty 1

## 2011-09-21 MED ORDER — ACETAMINOPHEN 10 MG/ML IV SOLN
INTRAVENOUS | Status: AC
  Filled 2011-09-21: qty 100

## 2011-09-21 MED ORDER — ONDANSETRON HCL 4 MG/2ML IJ SOLN: 4.0000 mg | Freq: Four times a day (QID) | INTRAMUSCULAR | Status: DC | PRN

## 2011-09-21 MED ORDER — ACETAMINOPHEN 10 MG/ML IV SOLN
1000.0000 mg | Freq: Four times a day (QID) | INTRAVENOUS | Status: DC
  Administered 2011-09-21: 1000 mg via INTRAVENOUS
  Filled 2011-09-21 (×3): qty 100

## 2011-09-21 MED ORDER — WARFARIN SODIUM 7.5 MG PO TABS
7.5000 mg | ORAL_TABLET | Freq: Once | ORAL | Status: AC
  Administered 2011-09-21: 7.5 mg via ORAL
  Filled 2011-09-21: qty 1

## 2011-09-21 MED ORDER — POLYETHYLENE GLYCOL 3350 17 G PO PACK
17.0000 g | PACK | Freq: Every day | ORAL | Status: DC | PRN
  Filled 2011-09-21: qty 1

## 2011-09-21 MED ORDER — DEXAMETHASONE SODIUM PHOSPHATE 4 MG/ML IJ SOLN
INTRAMUSCULAR | Status: DC | PRN
  Administered 2011-09-21: 4 mg via INTRAVENOUS

## 2011-09-21 MED ORDER — DOCUSATE SODIUM 100 MG PO CAPS
100.0000 mg | ORAL_CAPSULE | Freq: Two times a day (BID) | ORAL | Status: DC
  Administered 2011-09-21 – 2011-09-24 (×7): 100 mg via ORAL
  Filled 2011-09-21 (×8): qty 1

## 2011-09-21 MED ORDER — TEMAZEPAM 15 MG PO CAPS: 15.0000 mg | ORAL_CAPSULE | Freq: Every evening | ORAL | Status: DC | PRN

## 2011-09-21 MED ORDER — METHOCARBAMOL 500 MG PO TABS
500.0000 mg | ORAL_TABLET | Freq: Four times a day (QID) | ORAL | Status: DC | PRN
  Administered 2011-09-22 – 2011-09-23 (×3): 500 mg via ORAL
  Filled 2011-09-21 (×4): qty 1

## 2011-09-21 MED ORDER — FLEET ENEMA 7-19 GM/118ML RE ENEM: 1.0000 | ENEMA | Freq: Every day | RECTAL | Status: DC | PRN

## 2011-09-21 MED ORDER — ROCURONIUM BROMIDE 100 MG/10ML IV SOLN
INTRAVENOUS | Status: DC | PRN
  Administered 2011-09-21: 50 mg via INTRAVENOUS
  Administered 2011-09-21: 10 mg via INTRAVENOUS

## 2011-09-21 MED ORDER — MIDAZOLAM HCL 5 MG/5ML IJ SOLN
INTRAMUSCULAR | Status: DC | PRN
  Administered 2011-09-21: 2 mg via INTRAVENOUS

## 2011-09-21 MED ORDER — GLYCOPYRROLATE 0.2 MG/ML IJ SOLN
INTRAMUSCULAR | Status: DC | PRN
  Administered 2011-09-21: .6 mg via INTRAVENOUS

## 2011-09-21 MED ORDER — HYDROMORPHONE HCL PF 1 MG/ML IJ SOLN
0.2500 mg | INTRAMUSCULAR | Status: DC | PRN
  Administered 2011-09-21 (×4): 0.25 mg via INTRAVENOUS

## 2011-09-21 MED ORDER — BISACODYL 10 MG RE SUPP
10.0000 mg | Freq: Every day | RECTAL | Status: DC | PRN
  Administered 2011-09-23: 10 mg via RECTAL
  Filled 2011-09-21: qty 1

## 2011-09-21 MED ORDER — METOCLOPRAMIDE HCL 5 MG/ML IJ SOLN
5.0000 mg | Freq: Three times a day (TID) | INTRAMUSCULAR | Status: DC | PRN
  Filled 2011-09-21: qty 2

## 2011-09-21 MED ORDER — ONDANSETRON HCL 4 MG PO TABS: 4.0000 mg | ORAL_TABLET | Freq: Four times a day (QID) | ORAL | Status: DC | PRN

## 2011-09-21 MED ORDER — PHENOL 1.4 % MT LIQD
1.0000 | OROMUCOSAL | Status: DC | PRN
  Filled 2011-09-21: qty 177

## 2011-09-21 MED ORDER — MENTHOL 3 MG MT LOZG: 1.0000 | LOZENGE | OROMUCOSAL | Status: DC | PRN

## 2011-09-21 MED ORDER — METOCLOPRAMIDE HCL 10 MG PO TABS
5.0000 mg | ORAL_TABLET | Freq: Three times a day (TID) | ORAL | Status: DC | PRN
  Administered 2011-09-23: 10 mg via ORAL
  Filled 2011-09-21: qty 1

## 2011-09-21 MED ORDER — ALUM & MAG HYDROXIDE-SIMETH 200-200-20 MG/5ML PO SUSP: 30.0000 mL | ORAL | Status: DC | PRN

## 2011-09-21 MED ORDER — PROPOFOL 10 MG/ML IV EMUL
INTRAVENOUS | Status: DC | PRN
  Administered 2011-09-21: 200 mL via INTRAVENOUS

## 2011-09-21 MED ORDER — HETASTARCH-ELECTROLYTES 6 % IV SOLN
INTRAVENOUS | Status: DC | PRN
  Administered 2011-09-21: 09:00:00 via INTRAVENOUS

## 2011-09-21 MED ORDER — ZOLPIDEM TARTRATE 5 MG PO TABS: 5.0000 mg | ORAL_TABLET | Freq: Every evening | ORAL | Status: DC | PRN

## 2011-09-21 MED ORDER — FENTANYL CITRATE 0.05 MG/ML IJ SOLN
INTRAMUSCULAR | Status: DC | PRN
  Administered 2011-09-21: 100 ug via INTRAVENOUS
  Administered 2011-09-21: 50 ug via INTRAVENOUS
  Administered 2011-09-21: 100 ug via INTRAVENOUS
  Administered 2011-09-21 (×3): 50 ug via INTRAVENOUS

## 2011-09-21 MED ORDER — HYDROCODONE-ACETAMINOPHEN 5-325 MG PO TABS
1.0000 | ORAL_TABLET | ORAL | Status: DC | PRN
  Administered 2011-09-23: 2 via ORAL
  Administered 2011-09-24: 1 via ORAL
  Filled 2011-09-21: qty 1
  Filled 2011-09-21: qty 2

## 2011-09-21 MED ORDER — ONDANSETRON HCL 4 MG/2ML IJ SOLN: 4.0000 mg | Freq: Once | INTRAMUSCULAR | Status: DC | PRN

## 2011-09-21 MED ORDER — ACETAMINOPHEN 325 MG PO TABS: 650.0000 mg | ORAL_TABLET | Freq: Four times a day (QID) | ORAL | Status: DC | PRN

## 2011-09-21 MED ORDER — SODIUM CHLORIDE 0.9 % IR SOLN
Status: DC | PRN
  Administered 2011-09-21: 1000 mL

## 2011-09-21 MED ORDER — BISACODYL 5 MG PO TBEC
10.0000 mg | DELAYED_RELEASE_TABLET | Freq: Every day | ORAL | Status: DC | PRN
  Administered 2011-09-23: 10 mg via ORAL
  Filled 2011-09-21: qty 2

## 2011-09-21 MED ORDER — ONDANSETRON HCL 4 MG/2ML IJ SOLN
INTRAMUSCULAR | Status: DC | PRN
  Administered 2011-09-21: 4 mg via INTRAVENOUS

## 2011-09-21 MED ORDER — METFORMIN HCL 500 MG PO TABS
1000.0000 mg | ORAL_TABLET | Freq: Two times a day (BID) | ORAL | Status: DC
  Administered 2011-09-21 – 2011-09-24 (×6): 1000 mg via ORAL
  Filled 2011-09-21 (×8): qty 2

## 2011-09-21 MED ORDER — NEOSTIGMINE METHYLSULFATE 1 MG/ML IJ SOLN
INTRAMUSCULAR | Status: DC | PRN
  Administered 2011-09-21: 4 mg via INTRAVENOUS

## 2011-09-21 MED ORDER — HYDROMORPHONE HCL PF 1 MG/ML IJ SOLN
0.5000 mg | INTRAMUSCULAR | Status: DC | PRN
  Administered 2011-09-21 (×2): 1 mg via INTRAVENOUS
  Filled 2011-09-21 (×2): qty 1

## 2011-09-21 MED ORDER — METHOCARBAMOL 100 MG/ML IJ SOLN
500.0000 mg | Freq: Four times a day (QID) | INTRAVENOUS | Status: DC | PRN
  Administered 2011-09-21: 500 mg via INTRAVENOUS
  Filled 2011-09-21 (×2): qty 5

## 2011-09-21 MED ORDER — PHENYLEPHRINE HCL 10 MG/ML IJ SOLN
INTRAMUSCULAR | Status: DC | PRN
  Administered 2011-09-21 (×4): 40 ug via INTRAVENOUS

## 2011-09-21 MED ORDER — INSULIN ASPART 100 UNIT/ML ~~LOC~~ SOLN
0.0000 [IU] | Freq: Three times a day (TID) | SUBCUTANEOUS | Status: DC
  Administered 2011-09-21 – 2011-09-22 (×2): 2 [IU] via SUBCUTANEOUS
  Administered 2011-09-23 (×2): 3 [IU] via SUBCUTANEOUS
  Administered 2011-09-23 – 2011-09-24 (×2): 2 [IU] via SUBCUTANEOUS
  Filled 2011-09-21: qty 3

## 2011-09-21 MED ORDER — CEFAZOLIN SODIUM 1-5 GM-% IV SOLN
1.0000 g | Freq: Four times a day (QID) | INTRAVENOUS | Status: AC
  Administered 2011-09-21 – 2011-09-22 (×3): 1 g via INTRAVENOUS
  Filled 2011-09-21 (×3): qty 50

## 2011-09-21 SURGICAL SUPPLY — 45 items
BLADE SAW SAG 73X25 THK (BLADE) ×1
BLADE SAW SGTL 73X25 THK (BLADE) ×1 IMPLANT
BLADE SURG 10 STRL SS (BLADE) ×1 IMPLANT
BLADE SURG 21 STRL SS (BLADE) ×1 IMPLANT
BRUSH FEMORAL CANAL (MISCELLANEOUS) IMPLANT
CAP HI DEMAND HIP ×1 IMPLANT
CLOTH BEACON ORANGE TIMEOUT ST (SAFETY) ×2 IMPLANT
COVER BACK TABLE 24X17X13 BIG (DRAPES) ×1 IMPLANT
COVER LIGHT HANDLE  DEROYL (MISCELLANEOUS) ×3 IMPLANT
DRAPE INCISE IOBAN 85X60 (DRAPES) ×2 IMPLANT
DRAPE ORTHO SPLIT 77X108 STRL (DRAPES) ×4
DRAPE SURG ORHT 6 SPLT 77X108 (DRAPES) ×2 IMPLANT
DRAPE U-SHAPE 47X51 STRL (DRAPES) ×2 IMPLANT
DRSG MEPILEX BORDER 4X12 (GAUZE/BANDAGES/DRESSINGS) ×1 IMPLANT
DRSG MEPILEX BORDER 4X8 (GAUZE/BANDAGES/DRESSINGS) IMPLANT
DURAPREP 26ML APPLICATOR (WOUND CARE) ×2 IMPLANT
ELECT BLADE 6.5 EXT (BLADE) IMPLANT
ELECT CAUTERY BLADE 6.4 (BLADE) ×2 IMPLANT
ELECT REM PT RETURN 9FT ADLT (ELECTROSURGICAL) ×2
ELECTRODE REM PT RTRN 9FT ADLT (ELECTROSURGICAL) ×1 IMPLANT
GLOVE BIOGEL PI IND STRL 9 (GLOVE) ×1 IMPLANT
GLOVE BIOGEL PI INDICATOR 9 (GLOVE) ×3
GLOVE SURG ORTHO 9.0 STRL STRW (GLOVE) ×4 IMPLANT
GOWN PREVENTION PLUS XLARGE (GOWN DISPOSABLE) ×3 IMPLANT
GOWN SRG XL XLNG 56XLVL 4 (GOWN DISPOSABLE) ×1 IMPLANT
GOWN STRL NON-REIN XL XLG LVL4 (GOWN DISPOSABLE) ×2
HANDPIECE INTERPULSE COAX TIP (DISPOSABLE)
KIT BASIN OR (CUSTOM PROCEDURE TRAY) ×2 IMPLANT
KIT ROOM TURNOVER OR (KITS) ×2 IMPLANT
MANIFOLD NEPTUNE II (INSTRUMENTS) ×2 IMPLANT
NS IRRIG 1000ML POUR BTL (IV SOLUTION) ×2 IMPLANT
PACK TOTAL JOINT (CUSTOM PROCEDURE TRAY) ×2 IMPLANT
PAD ARMBOARD 7.5X6 YLW CONV (MISCELLANEOUS) ×3 IMPLANT
PRESSURIZER FEMORAL UNIV (MISCELLANEOUS) IMPLANT
SET HNDPC FAN SPRY TIP SCT (DISPOSABLE) IMPLANT
STAPLER VISISTAT 35W (STAPLE) ×2 IMPLANT
SUT ETHIBOND NAB CT1 #1 30IN (SUTURE) ×3 IMPLANT
SUT VIC AB 0 CT1 27 (SUTURE) ×4
SUT VIC AB 0 CT1 27XBRD ANBCTR (SUTURE) ×2 IMPLANT
SUT VIC AB 2-0 CTB1 (SUTURE) ×4 IMPLANT
TOWEL OR 17X24 6PK STRL BLUE (TOWEL DISPOSABLE) ×2 IMPLANT
TOWEL OR 17X26 10 PK STRL BLUE (TOWEL DISPOSABLE) ×2 IMPLANT
TOWER CARTRIDGE SMART MIX (DISPOSABLE) IMPLANT
TRAY FOLEY CATH 14FR (SET/KITS/TRAYS/PACK) IMPLANT
WATER STERILE IRR 1000ML POUR (IV SOLUTION) ×5 IMPLANT

## 2011-09-21 NOTE — Progress Notes (Signed)
Noted abnormal EKG.  No past medical hx of heart issues and no past EKG on chart.  OR notified to notify anesthesia prior to procedure.

## 2011-09-21 NOTE — Progress Notes (Signed)
ANTICOAGULATION CONSULT NOTE - Initial Consult  Pharmacy Consult for Coumadin  Indication: VTE prophylaxis   Medical History: Past Medical History  Diagnosis Date  . Diabetes mellitus   . Hyperlipemia     Assessment: 36 YOM s/p R total hip arthroplasty to start coumadin for VTE prophylaxis. Baseline INR 0.95 on 11/21  Goal of Therapy:  INR 2-3   Plan:  1) Coumadin 7.5 mg PO x1 tonight 2) f/u daily INR  Manley Mason 09/21/2011,1:42 PM

## 2011-09-21 NOTE — Anesthesia Postprocedure Evaluation (Signed)
  Anesthesia Post-op Note  Patient: Ian Rhodes  Procedure(s) Performed:  TOTAL HIP ARTHROPLASTY - Right Total Hip Arthroplasty  Patient Location: PACU  Anesthesia Type: General  Level of Consciousness: awake, alert  and oriented  Airway and Oxygen Therapy: Patient Spontanous Breathing and Patient connected to nasal cannula oxygen  Post-op Pain: mild  Post-op Assessment: Post-op Vital signs reviewed and Patient's Cardiovascular Status Stable  Post-op Vital Signs: stable  Complications: No apparent anesthesia complications

## 2011-09-21 NOTE — Anesthesia Preprocedure Evaluation (Addendum)
Anesthesia Evaluation   Patient awake    Reviewed: Allergy & Precautions, H&P , NPO status , Patient's Chart, lab work & pertinent test results, reviewed documented beta blocker date and time   Airway Mallampati: I TM Distance: >3 FB Neck ROM: Full    Dental  (+) Teeth Intact   Pulmonary  clear to auscultation        Cardiovascular Regular Normal    Neuro/Psych    GI/Hepatic   Endo/Other  Diabetes mellitus-, Well Controlled, Type 2, Oral Hypoglycemic Agents  Renal/GU      Musculoskeletal   Abdominal (+) obese,  Abdomen: soft.    Peds  Hematology   Anesthesia Other Findings   Reproductive/Obstetrics                          Anesthesia Physical Anesthesia Plan  ASA: III  Anesthesia Plan: General   Post-op Pain Management:    Induction: Intravenous  Airway Management Planned: Oral ETT  Additional Equipment:   Intra-op Plan:   Post-operative Plan: Extubation in OR  Informed Consent: I have reviewed the patients History and Physical, chart, labs and discussed the procedure including the risks, benefits and alternatives for the proposed anesthesia with the patient or authorized representative who has indicated his/her understanding and acceptance.   Dental advisory given  Plan Discussed with: CRNA and Surgeon  Anesthesia Plan Comments:         Anesthesia Quick Evaluation

## 2011-09-21 NOTE — Anesthesia Procedure Notes (Signed)
Procedure Name: Intubation Date/Time: 09/21/2011 8:42 AM Performed by: Arnaldo Natal Pre-anesthesia Checklist: Patient identified, Emergency Drugs available, Suction available and Patient being monitored Patient Re-evaluated:Patient Re-evaluated prior to inductionOxygen Delivery Method: Circle System Utilized Preoxygenation: Pre-oxygenation with 100% oxygen Intubation Type: IV induction Ventilation: Mask ventilation without difficulty Laryngoscope Size: Mac and 4 Grade View: Grade II Tube type: Oral Tube size: 8.0 mm Number of attempts: 1 Airway Equipment and Method: stylet Placement Confirmation: ETT inserted through vocal cords under direct vision,  positive ETCO2 and breath sounds checked- equal and bilateral Secured at: 23 cm Tube secured with: Tape Dental Injury: Teeth and Oropharynx as per pre-operative assessment

## 2011-09-21 NOTE — H&P (Signed)
Ian Rhodes is an 75 y.o. male.   Chief Complaint: Right hip pain HPI: Patient has had a chronic history of right hip pain. He has pain with activities of daily living he has failed conservative treatment and presents at this time for total hip arthroplasty. Patient has tried activity modifications anti-inflammatories ambulatory assistive aids all without relief.  Past Medical History  Diagnosis Date  . Diabetes mellitus   . Hyperlipemia     Past Surgical History  Procedure Date  . Spinal fusion   . Knee arthroscopy, medial patello femoral ligament reconstruction w/ hamstring graft     bilateral  . Eye surgery     two surgeries    No family history on file. Social History:  reports that he has quit smoking. He quit smokeless tobacco use about 17 years ago. His alcohol and drug histories not on file.  Allergies: No Known Allergies  Medications Prior to Admission  Medication Dose Route Frequency Provider Last Rate Last Dose  . ceFAZolin (ANCEF) IVPB 2 g/50 mL premix  2 g Intravenous 60 min Pre-Op Newt Minion, MD       Medications Prior to Admission  Medication Sig Dispense Refill  . aspirin 81 MG tablet Take 81 mg by mouth daily.        . metFORMIN (GLUCOPHAGE) 1000 MG tablet Take 1,000 mg by mouth 2 (two) times daily with a meal.        . Multiple Vitamins-Minerals (MULTIVITAMINS THER. W/MINERALS) TABS Take 1 tablet by mouth daily.        . pravastatin (PRAVACHOL) 40 MG tablet Take 80 mg by mouth at bedtime.          No results found for this or any previous visit (from the past 48 hour(s)). No results found.  ROS  There were no vitals taken for this visit. Physical Exam on examination patient does have an abductor lurch. He has about 10 of internal and external rotation of his hip. His right lower extremity is neurovascularly intact.  Assessment/Plan Assessment right hip osteoarthritis. Plan will plan for total hip arthroplasty. Risks and benefits were  discussed including infection neurovascular injury persistent pain DVT pulmonary embolus dislocation. Patient states he understands and wished to proceed at this time.  DUDA,MARCUS V 09/21/2011, 6:37 AM

## 2011-09-21 NOTE — Transfer of Care (Signed)
Immediate Anesthesia Transfer of Care Note  Patient: Ian Rhodes  Procedure(s) Performed:  TOTAL HIP ARTHROPLASTY - Right Total Hip Arthroplasty  Patient Location: PACU  Anesthesia Type: General  Level of Consciousness: sedated  Airway & Oxygen Therapy: Patient Spontanous Breathing and Patient connected to nasal cannula oxygen  Post-op Assessment: Report given to PACU RN, Post -op Vital signs reviewed and stable and Patient moving all extremities  Post vital signs: Reviewed and stable  Complications: No apparent anesthesia complications

## 2011-09-21 NOTE — Op Note (Signed)
OPERATIVE REPORT  DATE OF SURGERY: 09/21/2011  PATIENT:  Ian Rhodes,  75 y.o. male  PRE-OPERATIVE DIAGNOSIS:  Right Hip Osteoarthritis  POST-OPERATIVE DIAGNOSIS:  Right Hip Osteoarthritis  PROCEDURE:  Procedure(s): TOTAL HIP ARTHROPLASTY Zimmer components with 13.5 mm stem 56 mm acetabulum +0 liner with a CC neck and 36 mm metal head  SURGEON:  Surgeon(s): Newt Minion, MD  ANESTHESIA:   general  EBL:  Minimal ML  SPECIMEN:  No Specimen  TOURNIQUET:  * No tourniquets in log *  PROCEDURE DETAILS: Patient is a 75 year old gentleman with osteoarthritis of his right hip. He has failed conservative care has pain with activities of daily living has failed activity modifications as well as assistive devices anti-inflammatories and presents at this time for total hip arthroplasty. Risks and benefits were discussed patient states he understands was pursued this time. Description of procedure patient was brought to OR room tendon underwent a general anesthetic. After adequate levels of anesthesia were obtained patient was placed in the left lateral decubitus position with the right side up and the right lower extremity was prepped using DuraPrep and draped into a sterile field. A posterior lateral incision was made this was carried down to the tensor fascia lata which was split retractors were placed the capsule piriformis and short external rotators were tagged cut and retracted off the femoral neck. The hip was dislocated. The femoral neck cut was made 1 cm proximal to the calcar. Attention was first focused on the acetabulum. The acetabulum was sequentially reamed to 56 mm and a 56 mm acetabulum was inserted a 45 of abduction and 20 of anteversion. The final liner was then placed. The femoral canal was then broached up to a size 13.5 mm and a 13.5 mm stem was inserted. Femoral neck trials were tried and the patient had good stability with a CC neck which was lengthened and offset by 4  mm. The wound was irrigated with normal saline and the hip was reduced with the final head and neck. Patient had full extension full flexion and internal rotation with the hip flexed showed the hip was stable. The piriformis and short external rotators and capsule repaired using the #1 Ethibond. The sciatic nerve was evaluated and was intact there was no injury to the sciatic nerve. The tensor fascia lata was closed using #1 Vicryl subcutaneous was closed using 2-0 Vicryl the skin was closed using approximate staples and a Mepilex dressing was applied patient was placed in his TED hose extubated and taken to PACU in stable condition.  PLAN OF CARE: Admit to inpatient   PATIENT DISPOSITION:  PACU - hemodynamically stable.   Newt Minion, MD 09/21/2011 10:15 AM

## 2011-09-22 LAB — CBC
HCT: 33.8 % — ABNORMAL LOW (ref 39.0–52.0)
Hemoglobin: 11.4 g/dL — ABNORMAL LOW (ref 13.0–17.0)
MCH: 28.9 pg (ref 26.0–34.0)
MCHC: 33.7 g/dL (ref 30.0–36.0)
MCV: 85.8 fL (ref 78.0–100.0)
RBC: 3.94 MIL/uL — ABNORMAL LOW (ref 4.22–5.81)

## 2011-09-22 LAB — BASIC METABOLIC PANEL
CO2: 28 mEq/L (ref 19–32)
GFR calc Af Amer: 64 mL/min — ABNORMAL LOW (ref >90)
Sodium: 139 mEq/L (ref 135–145)

## 2011-09-22 LAB — GLUCOSE, CAPILLARY
Glucose-Capillary: 147 mg/dL — ABNORMAL HIGH (ref 70–99)
Glucose-Capillary: 116 mg/dL — ABNORMAL HIGH (ref 70–99)
Glucose-Capillary: 114 mg/dL — ABNORMAL HIGH (ref 70–99)

## 2011-09-22 MED ORDER — WARFARIN SODIUM 7.5 MG PO TABS
7.5000 mg | ORAL_TABLET | Freq: Once | ORAL | Status: AC
  Administered 2011-09-22: 7.5 mg via ORAL
  Filled 2011-09-22: qty 1

## 2011-09-22 NOTE — Progress Notes (Signed)
Subjective: 1 Day Post-Op Procedure(s) (LRB): TOTAL HIP ARTHROPLASTY (Right) Patient is comfortable states it is a slight amount of right hip pain.   Objective: Vital signs in last 24 hours: Temp:  [97.1 F (36.2 C)-98.3 F (36.8 C)] 98.3 F (36.8 C) (11/29 0620) Pulse Rate:  [62-95] 80  (11/29 0620) Resp:  [10-20] 19  (11/29 0620) BP: (114-177)/(55-77) 134/74 mmHg (11/29 0620) SpO2:  [92 %-98 %] 93 % (11/29 0620)  Intake/Output from previous day: 11/28 0701 - 11/29 0700 In: 1950 [I.V.:1400; IV Piggyback:550] Out: 500 [Blood:500] Intake/Output this shift:    No results found for this basename: HGB:5 in the last 72 hours No results found for this basename: WBC:2,RBC:2,HCT:2,PLT:2 in the last 72 hours No results found for this basename: NA:2,K:2,CL:2,CO2:2,BUN:2,CREATININE:2,GLUCOSE:2,CALCIUM:2 in the last 72 hours No results found for this basename: LABPT:2,INR:2 in the last 72 hours  Neurologically intact Patient has good neurologic function in both lower extremities both sensory and motor is intact in both legs.  Assessment/Plan: 1 Day Post-Op Procedure(s) (LRB): TOTAL HIP ARTHROPLASTY (Right) Up with therapy Plan for discharge to home this weekend.  Ailea Rhatigan V 09/22/2011, 7:07 AM

## 2011-09-22 NOTE — Progress Notes (Signed)
Physical Therapy Evaluation Patient Details Name: Ian Rhodes MRN: [id number] DOB: Apr 03, 1936 Today's Date: 09/22/2011  Problem List: There is no problem list on file for this patient.   Past Medical History:  Past Medical History  Diagnosis Date  . Diabetes mellitus   . Hyperlipemia    Past Surgical History:  Past Surgical History  Procedure Date  . Spinal fusion   . Knee arthroscopy, medial patello femoral ligament reconstruction w/ hamstring graft     bilateral  . Eye surgery     two surgeries    PT Assessment/Plan/Recommendation PT Assessment Clinical Impression Statement: Pt presents with a medical diagnosis of right THA along with the following impairments/deficits and therapy diagnosis listed below. Pt will benefit from skilled PT in the acute care setting in order to maximize functional mobility for a safe d/c home. PT Recommendation/Assessment: Patient will need skilled PT in the acute care venue PT Problem List: Decreased strength;Decreased range of motion;Decreased mobility;Decreased activity tolerance;Decreased knowledge of use of DME;Decreased knowledge of precautions;Pain PT Therapy Diagnosis : Difficulty walking;Acute pain PT Plan PT Frequency: 7X/week PT Treatment/Interventions: DME instruction;Stair training;Gait training;Functional mobility training;Therapeutic exercise;Patient/family education PT Recommendation Follow Up Recommendations: Home health PT;24 hour supervision/assistance Equipment Recommended: None recommended by PT PT Goals  Acute Rehab PT Goals PT Goal Formulation: With patient Time For Goal Achievement: 7 days Pt will go Supine/Side to Sit: with modified independence PT Goal: Supine/Side to Sit - Progress: Progressing toward goal Pt will go Sit to Supine/Side: with modified independence PT Goal: Sit to Supine/Side - Progress: Progressing toward goal Pt will Transfer Sit to Stand/Stand to Sit: with modified independence PT Transfer  Goal: Sit to Stand/Stand to Sit - Progress: Progressing toward goal Pt will Transfer Bed to Chair/Chair to Bed: with modified independence PT Transfer Goal: Bed to Chair/Chair to Bed - Progress: Progressing toward goal Pt will Ambulate: >150 feet;with supervision;with rolling walker PT Goal: Ambulate - Progress: Progressing toward goal Pt will Go Up / Down Stairs: 3-5 stairs;with rail(s) (Left rail) PT Goal: Up/Down Stairs - Progress: Other (comment) (unassessed today) Pt will Perform Home Exercise Program: Independently PT Goal: Perform Home Exercise Program - Progress: Progressing toward goal  PT Evaluation Precautions/Restrictions  Precautions Precautions: Posterior Hip Restrictions Weight Bearing Restrictions: Yes RLE Weight Bearing: Weight bearing as tolerated Prior Functioning  Home Living Lives With: Spouse Receives Help From: Family Type of Home: House Home Layout: One level Home Access: Stairs to enter Entrance Stairs-Rails: Left Entrance Stairs-Number of Steps: 3 Bathroom Shower/Tub: Gaffer;Door [employer] Toilet: Handicapped height Bathroom Accessibility: Yes How Accessible: Accessible via walker Home Adaptive Equipment: Hand-held shower hose;Shower chair without back;Walker - rolling Prior Function Level of Independence: Independent with basic ADLs;Independent with transfers;Independent with gait Able to Take Stairs?: Yes Driving: Yes Vocation: Retired Artist: Awake/alert Overall Cognitive Status: Appears within functional limits for tasks assessed Orientation Level: Oriented X4 Sensation/Coordination Sensation Light Touch: Appears Intact Extremity Assessment RUE Assessment RUE Assessment: Within Functional Limits LUE Assessment LUE Assessment: Within Functional Limits RLE Assessment RLE Assessment: Exceptions to [facility] RLE AROM (degrees) Overall AROM Right Lower Extremity: Deficits;Due to pain;Due to precautions (Knee  and Ankle WFL. Pt able to complete SLR with minimal A.) RLE Strength RLE Overall Strength: Deficits;Due to pain;Due to precautions (Knee and Ankle WFL) LLE Assessment LLE Assessment: Within Functional Limits Mobility (including Balance) Bed Mobility Bed Mobility: Yes Sit to Supine - Right: 3: Mod assist;With rail;HOB elevated (comment degrees) (30) Sit to Supine - Right Details (  indicate cue type and reason): Assist of trunk support and assist with RLE. VC for sequencing and reminders for hip precautions Transfers Transfers: Yes Sit to Stand: 4: Min assist;From bed;With upper extremity assist Sit to Stand Details (indicate cue type and reason): VC for sequencing to maintain hip precautions. Minimal assist with steadying Stand to Sit: 4: Min assist;To chair/3-in-1;With armrests;With upper extremity assist Stand to Sit Details: VC for hand placement and leg placement to maintain hip precautions Ambulation/Gait Ambulation/Gait: Yes Ambulation/Gait Assistance: 4: Min assist Ambulation/Gait Assistance Details (indicate cue type and reason): VC for sequencing and safety with RW. Ambulation Distance (Feet): 20 Feet Assistive device: Rolling walker Gait Pattern: Decreased hip/knee flexion - right;Decreased step length - left;Decreased stance time - right;Trunk flexed Gait velocity: Decreased gait speed Stairs: No  Balance Balance Assessed: Yes Static Standing Balance Static Standing - Balance Support: Left upper extremity supported Static Standing - Level of Assistance: 5: Stand by assistance Static Standing - Comment/# of Minutes: 3 (standing at sink while brushing teeth) Exercise  Total Joint Exercises Short Arc Quad: AROM;Strengthening;Right;5 reps;Supine Straight Leg Raises: AROM;Strengthening;Right;5 reps;Supine End of Session PT - End of Session Equipment Utilized During Treatment: Gait belt Activity Tolerance: Patient tolerated treatment well Patient left: in chair;with call  bell in reach Nurse Communication: Mobility status for transfers;Mobility status for ambulation General Behavior During Session: [facility] for tasks performed Cognition: [facility] for tasks performed  Ambrose Finland 09/22/2011, 9:46 AM  09/22/2011 Ambrose Finland DPT PAGER: [phone] OFFICE: [phone]

## 2011-09-22 NOTE — Progress Notes (Signed)
Occupational Therapy Evaluation Patient Details Name: Ian Rhodes MRN: [id number] DOB: Jun 18, 1936 Today's Date: 09/22/2011  Problem List: There is no problem list on file for this patient.   Past Medical History:  Past Medical History  Diagnosis Date  . Diabetes mellitus   . Hyperlipemia    Past Surgical History:  Past Surgical History  Procedure Date  . Spinal fusion   . Knee arthroscopy, medial patello femoral ligament reconstruction w/ hamstring graft     bilateral  . Eye surgery     two surgeries    OT Assessment/Plan/Recommendation OT Assessment Clinical Impression Statement: Pt presents with medical diagnosis of R THA.  Pt would benefit from AE education to increase I with ADLs due to posterior hip precautions.  Pt will benefit from OT services in the acute care setting to increase I with ADLs and functional transfers while maintaining posterior hip precautions in prep for d/c home with wife. OT Recommendation/Assessment: Patient will need skilled OT in the acute care venue OT Problem List: Decreased activity tolerance;Decreased knowledge of use of DME or AE;Decreased knowledge of precautions;Pain OT Therapy Diagnosis : Acute pain OT Plan OT Frequency: Min 2X/week OT Treatment/Interventions: Self-care/ADL training;DME and/or AE instruction;Therapeutic activities;Patient/family education OT Recommendation Follow Up Recommendations: None Equipment Recommended: None recommended by OT (Pt declined 3in1 since he has handicap height toilets at hom) Individuals Consulted Consulted and Agree with Results and Recommendations: Patient OT Goals Acute Rehab OT Goals OT Goal Formulation: With patient Time For Goal Achievement: 7 days ADL Goals Pt Will Perform Grooming: with modified independence;Standing at sink;Other (comment) (3 grooming tasks) ADL Goal: Grooming - Progress: Other (comment) Pt Will Perform Lower Body Bathing: with modified independence;Sit to stand  from bed;with adaptive equipment;Other (comment) (while maintaining posterior hip precautions) ADL Goal: Lower Body Bathing - Progress: Other (comment) Pt Will Perform Lower Body Dressing: with modified independence;Sit to stand from bed;with adaptive equipment;Other (comment) (while maintaining posterior hip precautions) ADL Goal: Lower Body Dressing - Progress: Other (comment) Pt Will Transfer to Toilet: with modified independence;Ambulation;with DME;3-in-1;Maintaining hip precautions ADL Goal: Toilet Transfer - Progress: Other (comment) Pt Will Perform Toileting - Clothing Manipulation: with modified independence;Standing;Other (comment) (while maintaining posterior hip precautions) ADL Goal: Toileting - Clothing Manipulation - Progress: Other (comment) Pt Will Perform Toileting - Hygiene: with modified independence;Sit to stand from 3-in-1/toilet;Standing at 3-in-1/toilet;Other (comment) (while maintaining posterior hip precautions) ADL Goal: Toileting - Hygiene - Progress: Other (comment) Pt Will Perform Tub/Shower Transfer: Shower transfer;with modified independence;Ambulation;with DME;Shower seat with back;Maintaining hip precautions ADL Goal: Tub/Shower Transfer - Progress: Other (comment) Miscellaneous OT Goals Miscellaneous OT Goal #1: Pt will perform bed mobility with mod I with HOB flat and without use of bed rails while maintaining posterior hip precautions in prep for EOB ADLs. OT Goal: Miscellaneous Goal #1 - Progress: Other (comment)  OT Evaluation Precautions/Restrictions  Precautions Precautions: Posterior Hip Restrictions Weight Bearing Restrictions: Yes RLE Weight Bearing: Weight bearing as tolerated Prior Functioning Home Living Lives With: Spouse Receives Help From: Family Type of Home: House Home Layout: One level Home Access: Stairs to enter Entrance Stairs-Rails: Left Entrance Stairs-Number of Steps: 3 Bathroom Shower/Tub: Gaffer;Door [employer] Toilet:  Handicapped height Bathroom Accessibility: Yes How Accessible: Accessible via walker Home Adaptive Equipment: Hand-held shower hose;Shower chair without back;Walker - rolling Prior Function Level of Independence: Independent with basic ADLs;Independent with transfers;Independent with gait Able to Take Stairs?: Yes Driving: Yes Vocation: Retired ADL ADL Eating/Feeding: Simulated;Independent Where Assessed - Eating/Feeding: Edge of bed Grooming:  Performed;Teeth care;Supervision/safety Grooming Details (indicate cue type and reason): Supervision for safety and maintaining posterior hip precautions Where Assessed - Grooming: Standing at sink Upper Body Bathing: Simulated;Set up Upper Body Bathing Details (indicate cue type and reason): Setup assist to gather bathing materials Where Assessed - Upper Body Bathing: Sitting, bed;Unsupported Lower Body Bathing: Simulated;Moderate assistance Lower Body Bathing Details (indicate cue type and reason): Mod assist due to posterior hip precautions Where Assessed - Lower Body Bathing: Sitting, bed;Unsupported Upper Body Dressing: Performed;Set up Upper Body Dressing Details (indicate cue type and reason): Setup assist to retrieve gown and to snap gown around IV line Where Assessed - Upper Body Dressing: Sitting, bed;Unsupported Lower Body Dressing: Performed;Moderate assistance Lower Body Dressing Details (indicate cue type and reason): Mod assist to thread bilateral legs through undergarment due to posterior hip precautions and for steadying when standing to pull undergarment up over hips. Where Assessed - Lower Body Dressing: Sit to stand from bed;Supported Toilet Transfer: Simulated;Minimal assistance Toilet Transfer Details (indicate cue type and reason): Simulated toilet transfer with ambulation to chair. Toilet Transfer Method: Counselling psychologist: Other (comment) (Pt transferred to chair) Toileting - Clothing Manipulation:  Simulated;Minimal assistance Toileting - Clothing Manipulation Details (indicate cue type and reason): Min assist for steadying while performing task in standing Where Assessed - Toileting Clothing Manipulation: Standing Toileting - Hygiene: Simulated;Supervision/safety Toileting - Hygiene Details (indicate cue type and reason): Supervision to maintain posterior hip precautions Where Assessed - Toileting Hygiene: Standing Tub/Shower Transfer: Not assessed Tub/Shower Transfer Method: Not assessed Equipment Used: Rolling walker ADL Comments: Pt with limited I with ADLs due to pain and posterior hip precautions. Vision/Perception    Cognition Cognition Arousal/Alertness: Awake/alert Overall Cognitive Status: Appears within functional limits for tasks assessed Orientation Level: Oriented X4 Sensation/Coordination Sensation Light Touch: Appears Intact Extremity Assessment RUE Assessment RUE Assessment: Within Functional Limits LUE Assessment LUE Assessment: Within Functional Limits Mobility  Bed Mobility Bed Mobility: Yes Sit to Supine - Right: 3: Mod assist;With rail;HOB elevated (comment degrees) (30) Sit to Supine - Right Details (indicate cue type and reason): Mod assist for trunk support and RLE support.  VC for sequencing and maintaining posterior hip precautions Transfers Transfers: Yes Sit to Stand: 4: Min assist;From bed;With upper extremity assist Sit to Stand Details (indicate cue type and reason): VC for sequencing and hand placement Stand to Sit: 4: Min assist;To chair/3-in-1;With armrests;With upper extremity assist Stand to Sit Details: VC for sequencing and hand placement Exercises   End of Session OT - End of Session Equipment Utilized During Treatment: Gait belt Activity Tolerance: Patient limited by pain Patient left: in chair;with call bell in reach General Behavior During Session: [facility] for tasks performed Cognition: [facility] for tasks performed   Darrol Jump 09/22/2011, 9:20 AM  09/22/2011 Darrol Jump OTR/L Pager [phone] Office [phone]

## 2011-09-22 NOTE — Progress Notes (Signed)
ANTICOAGULATION CONSULT NOTE - Follow Up Consult  Pharmacy Consult for warfarin Indication: VTE prophylaxis  No Known Allergies  Patient Measurements:     Vital Signs: Temp: 98.3 F (36.8 C) (11/29 0620) BP: 134/74 mmHg (11/29 0620) Pulse Rate: 80  (11/29 0620)  Labs:  Basename 09/22/11 0550  HGB 11.4*  HCT 33.8*  PLT 126*  APTT --  LABPROT 14.1  INR 1.07  HEPARINUNFRC --  CREATININE 1.25  CKTOTAL --  CKMB --  TROPONINI --   CrCl is unknown because there is no height on file for the current visit.   Medications:  Scheduled:    . aspirin  81 mg Oral Daily  . ceFAZolin (ANCEF) IV  1 g Intravenous Q6H  . docusate sodium  100 mg Oral BID  . ferrous sulfate  325 mg Oral TID PC  . insulin aspart  0-15 Units Subcutaneous TID WC  . metFORMIN  1,000 mg Oral BID WC  . multivitamins ther. w/minerals  1 tablet Oral Daily  . patient's guide to using coumadin book   Does not apply Once  . simvastatin  20 mg Oral q1800  . warfarin  7.5 mg Oral ONCE-1800  . warfarin   Does not apply Once  . DISCONTD: acetaminophen  1,000 mg Intravenous Q6H    Assessment: 75 yo M on warfarin for VTE pxl s/p THA.  Hgb and PLT slightly decreased from baseline.  No bleeding noted.  Goal of Therapy:  INR 2-3   Plan:  Warfarin 7.5 mg po x1. Daily INR.  Kenedy Haisley L. Amada Jupiter, PharmD, [place] Clinical Pharmacist Pager: [phone] 09/22/2011 11:44 AM

## 2011-09-22 NOTE — Progress Notes (Signed)
Physical Therapy Treatment Patient Details Name: Ian Rhodes MRN: [id number] DOB: Feb 18, 1936 Today's Date: 09/22/2011  PT Assessment/Plan  PT - Assessment/Plan Comments on Treatment Session: Pt progressing well, he was able to increase ambulation distance and required  less assistance for bed mobility. Attempt stairs tomorrow pending pt progress PT Plan: Discharge plan remains appropriate;Frequency remains appropriate PT Frequency: 7X/week Follow Up Recommendations: Home health PT;24 hour supervision/assistance Equipment Recommended: None recommended by PT PT Goals  Acute Rehab PT Goals PT Goal Formulation: With patient Time For Goal Achievement: 7 days PT Goal: Supine/Side to Sit - Progress: Progressing toward goal PT Goal: Sit to Supine/Side - Progress: Progressing toward goal PT Transfer Goal: Sit to Stand/Stand to Sit - Progress: Progressing toward goal PT Transfer Goal: Bed to Chair/Chair to Bed - Progress: Progressing toward goal PT Goal: Ambulate - Progress: Progressing toward goal PT Goal: Up/Down Stairs - Progress: Other (comment) (Unassessed today) PT Goal: Perform Home Exercise Program - Progress: Progressing toward goal  PT Treatment Precautions/Restrictions  Precautions Precautions: Posterior Hip Restrictions Weight Bearing Restrictions: Yes RLE Weight Bearing: Weight bearing as tolerated Mobility (including Balance) Bed Mobility Bed Mobility: Yes Sit to Supine - Right: 3: Mod assist;With rail;HOB elevated (comment degrees) (30) Sit to Supine - Right Details (indicate cue type and reason): VC for proper sequencing. Assist of RLE into supine with minimal trunk support Transfers Transfers: Yes Sit to Stand: 4: Min assist;From chair/3-in-1;With upper extremity assist Sit to Stand Details (indicate cue type and reason): VC for hand placement Stand to Sit: 4: Min assist Stand to Sit Details: VC for hand placement and assist to bring RLE out to maintain hip  precautions Ambulation/Gait Ambulation/Gait: Yes Ambulation/Gait Assistance: 4: Min assist Ambulation/Gait Assistance Details (indicate cue type and reason): VC for proper gait sequencing as well as safety with RW and postural cues Ambulation Distance (Feet): 30 Feet Assistive device: Rolling walker Gait Pattern: Decreased hip/knee flexion - right;Decreased step length - left;Decreased stance time - right;Trunk flexed Gait velocity: Decreased gait speed Stairs: No    Exercise  Total Joint Exercises Hip ABduction/ADduction: AROM;Strengthening;Right;10 reps;Supine Straight Leg Raises: AROM;10 reps;Right;Strengthening;Supine Long Arc Quad: Right;AROM;Strengthening;Seated;10 reps Marching in Standing: AROM;Seated;Strengthening;Right;10 reps (10 seated marches) End of Session PT - End of Session Equipment Utilized During Treatment: Gait belt Activity Tolerance: Patient tolerated treatment well Patient left: with call bell in reach;in bed Nurse Communication: Mobility status for transfers;Mobility status for ambulation General Behavior During Session: [facility] for tasks performed Cognition: [facility] for tasks performed  Ambrose Finland 09/22/2011, 3:29 PM  09/22/2011 Ambrose Finland DPT PAGER: [phone] OFFICE: [phone]

## 2011-09-23 ENCOUNTER — Encounter (HOSPITAL_COMMUNITY): Payer: Self-pay | Admitting: Orthopedic Surgery

## 2011-09-23 LAB — GLUCOSE, CAPILLARY
Glucose-Capillary: 144 mg/dL — ABNORMAL HIGH (ref 70–99)
Glucose-Capillary: 152 mg/dL — ABNORMAL HIGH (ref 70–99)
Glucose-Capillary: 148 mg/dL — ABNORMAL HIGH (ref 70–99)
Glucose-Capillary: 146 mg/dL — ABNORMAL HIGH (ref 70–99)

## 2011-09-23 LAB — BASIC METABOLIC PANEL
CO2: 26 mEq/L (ref 19–32)
Calcium: 8.3 mg/dL — ABNORMAL LOW (ref 8.4–10.5)
Creatinine, Ser: 1.17 mg/dL (ref 0.50–1.35)

## 2011-09-23 LAB — CBC
MCH: 29.6 pg (ref 26.0–34.0)
MCV: 85.6 fL (ref 78.0–100.0)
Platelets: 118 10*3/uL — ABNORMAL LOW (ref 150–400)
RBC: 3.95 MIL/uL — ABNORMAL LOW (ref 4.22–5.81)
RDW: 14.2 % (ref 11.5–15.5)

## 2011-09-23 MED ORDER — OXYCODONE-ACETAMINOPHEN 5-325 MG PO TABS: 1.0000 | ORAL_TABLET | ORAL | Status: AC | PRN

## 2011-09-23 MED ORDER — WARFARIN SODIUM 7.5 MG PO TABS
7.5000 mg | ORAL_TABLET | Freq: Once | ORAL | Status: AC
  Administered 2011-09-23: 7.5 mg via ORAL
  Filled 2011-09-23: qty 1

## 2011-09-23 MED ORDER — WARFARIN SODIUM 1 MG PO TABS: 1.0000 mg | ORAL_TABLET | Freq: Every day | ORAL | Status: AC

## 2011-09-23 NOTE — Progress Notes (Signed)
ANTICOAGULATION CONSULT NOTE - Follow Up Consult  Pharmacy Consult for warfarin Indication: VTE prophylaxis  No Known Allergies  Patient Measurements:    Vital Signs: Temp: 98.5 F (36.9 C) (11/30 0551) BP: 151/72 mmHg (11/30 0551) Pulse Rate: 104  (11/30 0551)  Labs:  Basename 09/23/11 0541 09/22/11 0550  HGB 11.7* 11.4*  HCT 33.8* 33.8*  PLT 118* 126*  APTT -- --  LABPROT 15.8* 14.1  INR 1.23 1.07  HEPARINUNFRC -- --  CREATININE 1.17 1.25  CKTOTAL -- --  CKMB -- --  TROPONINI -- --   CrCl is unknown because there is no height on file for the current visit.   Medications:  Scheduled:    . aspirin  81 mg Oral Daily  . docusate sodium  100 mg Oral BID  . ferrous sulfate  325 mg Oral TID PC  . insulin aspart  0-15 Units Subcutaneous TID WC  . metFORMIN  1,000 mg Oral BID WC  . multivitamins ther. w/minerals  1 tablet Oral Daily  . simvastatin  20 mg Oral q1800  . warfarin  7.5 mg Oral ONCE-1800  . warfarin   Does not apply Once    Assessment: 75 yo male s/p ortho surgery and POD #3.  Patient is ambulating and is scheduled for discharge in the morning.  Hgb/hct is a little low and glucose is a little high.  INR is slowly rising,with INR of 1.23 today. Discharge orders written for possibly Saturday or Sunday.  Called Dr. Sharol Given who really wants patient to have only 1mg  coumadin at discharge. Will order 7.5mg  for today (and if patient does not go home tomorrow, we still need to dose for goal of 2 to 3 per protocol.  The 1mg  dose is for discharge (when number bumped out per Dr. Sharol Given).   Goal of Therapy:   2-3  Plan: Coumadin 7.5mg  at 1800 Check INR in am.  Joselyn Arrow B 09/23/2011,1:16 PM

## 2011-09-23 NOTE — Progress Notes (Signed)
Pt is s/p R THR. Hip precautions. +cms. Pt is WBAT with RW and one assist. Pt uses bed pillow or abduction pillow for hip precautions. Pt is alert and oriented x 3. Pt repts that he is passing gas and tolerates diet fair but has had some intermittent nausea during the night and this AM. Upon assessment, pt noted to vomit 200cc liquid green emesis. Pt repts LBM 11/28. Pt did receive dulcolax PO during the night with no relief. Will administer dulcolax suppository per MD order this AM and anti emetics per order. Pt has a dry mepilex to R hip. Pt has no pressure skin issues of sacral area or heels. Pt encouraged to turn/tilt self and float heels to prevent skin breakdown. Pt refuses to turn due to hip discomfort. Pt lungs CTA. Heart rate regular rate and rhythm. Pt noted to be tachy in the low 100's at times. Pt noted to have 1+ edema of RLE.

## 2011-09-23 NOTE — Addendum Note (Signed)
Addendum  created 09/23/11 1250 by Janeece Riggers, MD   Modules edited:Anesthesia Responsible Staff, Notes Section

## 2011-09-23 NOTE — Progress Notes (Signed)
Occupational Therapy Treatment Patient Details Name: Ian Rhodes MRN: [id number] DOB: [birth year] Today's Date: 09/23/2011  OT Assessment/Plan OT Assessment/Plan Comments on Treatment Session: Pt progressing towards goals. OT Plan: Discharge plan remains appropriate OT Frequency: Min 2X/week Follow Up Recommendations: None Equipment Recommended: None recommended by OT OT Goals ADL Goals Pt Will Perform Grooming: with modified independence;Standing at sink;Other (comment) ADL Goal: Grooming - Progress: Not addressed Pt Will Perform Lower Body Bathing: with modified independence;Sit to stand from bed;with adaptive equipment;Other (comment) ADL Goal: Lower Body Bathing - Progress: Not addressed Pt Will Perform Lower Body Dressing: with modified independence;Sit to stand from bed;with adaptive equipment;Other (comment) ADL Goal: Lower Body Dressing - Progress: Not addressed Pt Will Transfer to Toilet: with modified independence;Ambulation;with DME;3-in-1;Maintaining hip precautions ADL Goal: Toilet Transfer - Progress: Progressing toward goals Pt Will Perform Toileting - Clothing Manipulation: with modified independence;Standing;Other (comment) ADL Goal: Toileting - Clothing Manipulation - Progress: Not addressed Pt Will Perform Toileting - Hygiene: with modified independence;Sit to stand from 3-in-1/toilet;Standing at 3-in-1/toilet;Other (comment) ADL Goal: Toileting - Hygiene - Progress: Not addressed Pt Will Perform Tub/Shower Transfer: Shower transfer;with modified independence;Ambulation;with DME;Shower seat with back;Maintaining hip precautions ADL Goal: Tub/Shower Transfer - Progress: Not addressed Miscellaneous OT Goals Miscellaneous OT Goal #1: Pt will perform bed mobility with mod I with HOB flat and without use of bed rails while maintaining posterior hip precautions in prep for EOB ADLs. OT Goal: Miscellaneous Goal #1 - Progress: Progressing toward goals  OT  Treatment Precautions/Restrictions  Precautions Precautions: Posterior Hip Restrictions Weight Bearing Restrictions: Yes RLE Weight Bearing: Weight bearing as tolerated   ADL ADL Upper Body Dressing: Performed;Independent Where Assessed - Upper Body Dressing: Sitting, bed;Unsupported Toilet Transfer: Simulated;Minimal assistance Toilet Transfer Method: Ambulating Equipment Used: Rolling walker ADL Comments: Pt reports that he is not interested in AE and will have assistance from wife. Mobility  Bed Mobility Bed Mobility: Yes Supine to Sit: HOB flat;3: Mod assist Sit to Supine - Right: 4: Min assist;HOB flat Transfers Transfers: Yes Sit to Stand: 4: Min assist;From bed;With upper extremity assist Sit to Stand Details (indicate cue type and reason): VC for hand placement Stand to Sit: 5: Supervision;With upper extremity assist;To bed Stand to Sit Details: Vc for hand placement Exercises    End of Session OT - End of Session Activity Tolerance: Patient limited by pain Patient left: in bed;with call bell in reach General Behavior During Session: [facility] for tasks performed Cognition: [facility] for tasks performed  Darrol Jump  09/23/2011, 3:44 PM 09/23/2011 Darrol Jump OTR/L Pager [phone] Office [phone]

## 2011-09-23 NOTE — Progress Notes (Signed)
Physical Therapy Treatment Patient Details Name: Ian Rhodes MRN: [id number] DOB: Aug 30, 1936 Today's Date: 09/23/2011  PT Assessment/Plan  PT - Assessment/Plan Comments on Treatment Session: Pt still progressing with increased ambulation distance. Pt not feeling well this session and requested to complete stairs next session secondary to feeling of weakness. Will attempt stairs next session prior to d/c PT Plan: Discharge plan remains appropriate;Frequency remains appropriate PT Frequency: 7X/week Follow Up Recommendations: Home health PT;24 hour supervision/assistance Equipment Recommended: None recommended by PT PT Goals  Acute Rehab PT Goals PT Goal Formulation: With patient Time For Goal Achievement: 7 days PT Goal: Supine/Side to Sit - Progress: Progressing toward goal PT Goal: Sit to Supine/Side - Progress: Progressing toward goal PT Transfer Goal: Sit to Stand/Stand to Sit - Progress: Progressing toward goal PT Transfer Goal: Bed to Chair/Chair to Bed - Progress: Progressing toward goal PT Goal: Ambulate - Progress: Progressing toward goal PT Goal: Up/Down Stairs - Progress: Progressing toward goal PT Goal: Perform Home Exercise Program - Progress: Progressing toward goal  PT Treatment Precautions/Restrictions  Precautions Precautions: Posterior Hip Restrictions Weight Bearing Restrictions: Yes RLE Weight Bearing: Weight bearing as tolerated Mobility (including Balance) Bed Mobility Bed Mobility: No Transfers Transfers: Yes Sit to Stand: 4: Min assist;From chair/3-in-1;With upper extremity assist Sit to Stand Details (indicate cue type and reason): VC for hand placement. Assist with steadying into standing secondary to weakness Stand to Sit: 5: Supervision;With upper extremity assist;To chair/3-in-1 Stand to Sit Details: VC for hand placement Ambulation/Gait Ambulation/Gait: Yes Ambulation/Gait Assistance: 4: Min assist;Other (comment) ([place]  assist) Ambulation/Gait Assistance Details (indicate cue type and reason): VC for proper gait sequencing and postural cues. Pt required increased cueing for weight shifting to clear LEs Ambulation Distance (Feet): 40 Feet Assistive device: Rolling walker Gait Pattern: Decreased hip/knee flexion - right;Decreased step length - left;Decreased stance time - right;Trunk flexed Gait velocity: Decreased gait speed Stairs: No    Exercise  Total Joint Exercises Hip ABduction/ADduction: AROM;Strengthening;Right;10 reps;Supine Straight Leg Raises: AROM;10 reps;Right;Strengthening;Supine Long Arc Quad: Right;AROM;Strengthening;Seated;10 reps Marching in Standing: AROM;Seated;Strengthening;Right;10 reps End of Session PT - End of Session Equipment Utilized During Treatment: Gait belt Activity Tolerance: Patient tolerated treatment well Patient left: in bed;Other (comment) (RN in room) Nurse Communication: Mobility status for transfers;Mobility status for ambulation General Behavior During Session: [facility] for tasks performed Cognition: [facility] for tasks performed  Ambrose Finland 09/23/2011, 10:39 AM  09/23/2011 Ambrose Finland DPT PAGER: [phone] OFFICE: [phone]

## 2011-09-23 NOTE — Anesthesia Postprocedure Evaluation (Deleted)
  Anesthesia Post-op Note  Patient: Ian Rhodes  Procedure(s) Performed:  TOTAL HIP ARTHROPLASTY - Right Total Hip Arthroplasty  Patient Location: PACU  Anesthesia Type: General  Level of Consciousness: awake, alert  and oriented  Airway and Oxygen Therapy: Patient Spontanous Breathing and Patient connected to nasal cannula oxygen  Post-op Pain: mild  Post-op Assessment: Post-op Vital signs reviewed  Post-op Vital Signs: stable  Complications: No apparent anesthesia complications

## 2011-09-23 NOTE — Progress Notes (Signed)
Physical Therapy Treatment Patient Details Name: Ian Rhodes MRN: [id number] DOB: [birth year] Today's Date: 09/23/2011  PT Assessment/Plan  PT - Assessment/Plan Comments on Treatment Session: Pt progressing well. He was able to complete increased ambulation distance as well as safely complete steps. Continue per plan PT Plan: Discharge plan remains appropriate;Frequency remains appropriate PT Frequency: 7X/week Follow Up Recommendations: Home health PT;24 hour supervision/assistance Equipment Recommended: None recommended by PT PT Goals  Acute Rehab PT Goals PT Goal Formulation: With patient Time For Goal Achievement: 7 days PT Goal: Supine/Side to Sit - Progress: Progressing toward goal PT Goal: Sit to Supine/Side - Progress: Progressing toward goal PT Transfer Goal: Sit to Stand/Stand to Sit - Progress: Progressing toward goal PT Transfer Goal: Bed to Chair/Chair to Bed - Progress: Progressing toward goal PT Goal: Ambulate - Progress: Progressing toward goal PT Goal: Up/Down Stairs - Progress: Progressing toward goal PT Goal: Perform Home Exercise Program - Progress: Progressing toward goal  PT Treatment Precautions/Restrictions  Precautions Precautions: Posterior Hip Restrictions Weight Bearing Restrictions: Yes RLE Weight Bearing: Weight bearing as tolerated Mobility (including Balance) Bed Mobility Bed Mobility: Yes Supine to Sit: HOB flat;3: Mod assist Sit to Supine - Right: 4: Min assist;HOB flat Transfers Sit to Stand: 4: Min assist;From bed;With upper extremity assist Sit to Stand Details (indicate cue type and reason): VC for hand placement Stand to Sit: 5: Supervision;With upper extremity assist;To bed Stand to Sit Details: VC for hand placement Ambulation/Gait Ambulation/Gait: Yes Ambulation/Gait Assistance: 4: Min assist;Other (comment) (Minguard assist) Ambulation/Gait Assistance Details (indicate cue type and reason): VC for increased heel strike and  even step length Ambulation Distance (Feet): 100 Feet (two trials) Assistive device: Rolling walker Gait Pattern: Decreased hip/knee flexion - right;Decreased step length - left;Decreased stance time - right;Trunk flexed;Right foot flat Gait velocity: Decreased gait speed Stairs: Yes Stairs Assistance: 4: Min assist Stairs Assistance Details (indicate cue type and reason): VC for proper stair sequencing and safety Stair Management Technique: One rail Left;Step to pattern;Forwards Number of Stairs: 2     Exercise    End of Session PT - End of Session Equipment Utilized During Treatment: Gait belt Activity Tolerance: Patient tolerated treatment well Patient left: in bed;with call bell in reach Nurse Communication: Mobility status for transfers;Mobility status for ambulation General Behavior During Session: [facility] for tasks performed Cognition: [facility] for tasks performed  Ambrose Finland 09/23/2011, 3:51 PM  09/23/2011 Ambrose Finland DPT PAGER: [phone] OFFICE: [phone]

## 2011-09-23 NOTE — Progress Notes (Signed)
Subjective: 2 Days Post-Op Procedure(s) (LRB): TOTAL HIP ARTHROPLASTY (Right) Patient is comfortable sitting in a chair this morning.    Objective: Vital signs in last 24 hours: Temp:  [98.5 F (36.9 C)-99.4 F (37.4 C)] 98.5 F (36.9 C) (11/30 0551) Pulse Rate:  [93-104] 104  (11/30 0551) Resp:  [18-20] 20  (11/30 0551) BP: (124-151)/(65-72) 151/72 mmHg (11/30 0551) SpO2:  [94 %-98 %] 94 % (11/30 0551)  Intake/Output from previous day: 11/29 0701 - 11/30 0700 In: [id number] [P.O.:720; I.V.:150] Out: 800 [Urine:800] Intake/Output this shift: Total I/O In: 390 [P.O.:240; I.V.:150] Out: 300 [Urine:300]   Basename 09/23/11 0541 09/22/11 0550  HGB 11.7* 11.4*    Basename 09/23/11 0541 09/22/11 0550  WBC 9.3 8.7  RBC 3.95* 3.94*  HCT 33.8* 33.8*  PLT PENDING 126*    Basename 09/22/11 0550  NA 139  K 3.8  CL 101  CO2 28  BUN 21  CREATININE 1.25  GLUCOSE 139*  CALCIUM 8.0*    Basename 09/23/11 0541 09/22/11 0550  LABPT -- --  INR 1.23 1.07    Neurologically intact  Assessment/Plan: 2 Days Post-Op Procedure(s) (LRB): TOTAL HIP ARTHROPLASTY (Right) Up with therapy Anticipate discharge to home on Saturday. Prescriptions on chart. Patient has home health agency that he has requested.  Kiron Osmun V 09/23/2011, 6:55 AM

## 2011-09-24 LAB — BASIC METABOLIC PANEL
GFR calc Af Amer: 66 mL/min — ABNORMAL LOW (ref >90)
GFR calc non Af Amer: 57 mL/min — ABNORMAL LOW (ref >90)
Potassium: 3.8 mEq/L (ref 3.5–5.1)
Sodium: 136 mEq/L (ref 135–145)

## 2011-09-24 LAB — GLUCOSE, CAPILLARY: Glucose-Capillary: 118 mg/dL — ABNORMAL HIGH (ref 70–99)

## 2011-09-24 LAB — PROTIME-INR
INR: 1.5 — ABNORMAL HIGH (ref 0.00–1.49)
Prothrombin Time: 18.4 seconds — ABNORMAL HIGH (ref 11.6–15.2)

## 2011-09-24 LAB — CBC
Hemoglobin: 11.5 g/dL — ABNORMAL LOW (ref 13.0–17.0)
MCHC: 34 g/dL (ref 30.0–36.0)
Platelets: 123 10*3/uL — ABNORMAL LOW (ref 150–400)
RDW: 14.3 % (ref 11.5–15.5)

## 2011-09-24 MED ORDER — WARFARIN SODIUM 7.5 MG PO TABS
7.5000 mg | ORAL_TABLET | Freq: Once | ORAL | Status: DC
  Filled 2011-09-24: qty 1

## 2011-09-24 NOTE — Progress Notes (Signed)
Pt stable for dc today

## 2011-09-24 NOTE — Progress Notes (Signed)
Physical Therapy Treatment Patient Details Name: DENNIE RATHSACK MRN: [id number] DOB: [birth year] Today's Date: 09/24/2011  PT Assessment/Plan  PT - Assessment/Plan Comments on Treatment Session: Pt. declined mobility at this time.  Educated on proper positioning in bed to maintain hip precautions; pt. verbalized understanding PT Plan: Discharge plan remains appropriate;Frequency remains appropriate PT Frequency: 7X/week Follow Up Recommendations: Home health PT Equipment Recommended: None recommended by PT PT Goals     PT Treatment Precautions/Restrictions  Precautions Precautions: Posterior Hip Precaution Comments: reviewed precautions and educated on safe mobility with precautions Restrictions Weight Bearing Restrictions: Yes RLE Weight Bearing: Weight bearing as tolerated Mobility (including Balance) Bed Mobility Bed Mobility: No Transfers Transfers: No Ambulation/Gait Ambulation/Gait: No    Exercise    End of Session PT - End of Session Activity Tolerance: Patient tolerated treatment well Patient left: in bed;with call bell in reach Nurse Communication: Other (comment) (pt. ready for d/c) General Behavior During Session: [facility] for tasks performed Cognition: [facility] for tasks performed  Feltis, Toben Acuna K 09/24/2011, 9:42 AM Jackqulyn Livings. Feltis, PT, DPT [phone]

## 2011-09-24 NOTE — Progress Notes (Signed)
Case Management:   09/24/11 1100--Pt. to be discharged today with [facility] PT.  Pt. chose [facility] prior to surgery.  Spoke with [facility] on call RN of pt. discharge today.  Faxed [facility] [place] PT orders to ([phone]).  Llana Aliment, RN, BSN Case Manager # [phone]

## 2011-09-24 NOTE — Progress Notes (Signed)
Occupational Therapy Treatment  Patient Details Name: Ian Rhodes MRN: [id number] DOB: 01-24-1936 Today's Date: 09/24/2011  OT Assessment/Plan OT Assessment/Plan Comments on Treatment Session: Pt progressing towards goals. OT Plan: Discharge plan remains appropriate OT Frequency: Min 2X/week Follow Up Recommendations: None Equipment Recommended: None recommended by PT OT Goals ADL Goals Pt Will Perform Grooming: with modified independence;Standing at sink;Other (comment) ADL Goal: Grooming - Progress: Met Pt Will Perform Lower Body Bathing: with modified independence;Sit to stand from bed;with adaptive equipment;Other (comment) ADL Goal: Lower Body Bathing - Progress: Partly met Pt Will Perform Lower Body Dressing: with modified independence;Sit to stand from bed;with adaptive equipment;Other (comment) ADL Goal: Lower Body Dressing - Progress: Partly met Pt Will Transfer to Toilet: with modified independence;Ambulation;with DME;3-in-1;Maintaining hip precautions ADL Goal: Toilet Transfer - Progress: Met Pt Will Perform Toileting - Clothing Manipulation: with modified independence;Standing;Other (comment) ADL Goal: Toileting - Clothing Manipulation - Progress: Met Pt Will Perform Toileting - Hygiene: with modified independence;Sit to stand from 3-in-1/toilet;Standing at 3-in-1/toilet;Other (comment) ADL Goal: Toileting - Hygiene - Progress: Met Pt Will Perform Tub/Shower Transfer: Shower transfer;with modified independence;Ambulation;with DME;Shower seat with back;Maintaining hip precautions ADL Goal: Tub/Shower Transfer - Progress: Not addressed Miscellaneous OT Goals Miscellaneous OT Goal #1: Pt will perform bed mobility with mod I with HOB flat and without use of bed rails while maintaining posterior hip precautions in prep for EOB ADLs. OT Goal: Miscellaneous Goal #1 - Progress: Progressing toward goals  OT Treatment Precautions/Restrictions  Precautions Precautions:  Posterior Hip Precaution Comments: reviewed precautions and educated on safe mobility with precautions Restrictions Weight Bearing Restrictions: Yes RLE Weight Bearing: Weight bearing as tolerated   ADL ADL Grooming: Performed;Wash/dry hands;Teeth care;Modified independent Where Assessed - Grooming: Standing at sink Upper Body Dressing: Performed;Modified independent Where Assessed - Upper Body Dressing: Standing;Unsupported Lower Body Dressing: Performed;Minimal assistance Lower Body Dressing Details (indicate cue type and reason): educated on dressing RLE first and use of AE Where Assessed - Lower Body Dressing: Sit to stand from bed;Unsupported Toilet Transfer: Performed;Modified independent Toilet Transfer Method: Counselling psychologist: Bedside commode Toileting - Clothing Manipulation: Performed;Modified independent Where Assessed - Toileting Clothing Manipulation: Sit to stand from 3-in-1 or toilet Toileting - Hygiene: Performed;Modified independent Where Assessed - Toileting Hygiene: Sit to stand from 3-in-1 or toilet Tub/Shower Transfer: Not assessed Equipment Used: Rolling walker ADL Comments: Pt educated on AE and ADLs though pt insist on using personal prefered method. Pt's wife states "he is stubborn" Pt repeatly verbalized plan to use rope on door knob at home. pt educated on unsafe to use environmental supports or pull on wife with transfers. Pt acknowledges education and again states plans to use rope and door knobs. Mobility  Bed Mobility Bed Mobility: Yes Rolling Left: 5: Supervision;With rail (pt increasing HOB with OT suggesting HOB remain below 20 ) Supine to Sit: 4: Min assist;HOB elevated (Comment degrees);With rails (35 degrees) Transfers Transfers: Yes Sit to Stand: 4: Min assist;With upper extremity assist;From elevated surface;From bed Stand to Sit: 5: Supervision;Without upper extremity assist;To bed Stand to Sit Details: min v/c to extend  LLE Exercises    End of Session OT - End of Session Activity Tolerance: Patient tolerated treatment well Patient left: in bed;with call bell in reach;with family/visitor present General Behavior During Session: [facility] for tasks performed Cognition: [facility] for tasks performed  Pt dressed and wife present for all education in session. Pt and wife to consider purchase of Hip kit from gift shop. Wife educated on d/c  parking and staff will assist with exiting the 5000 floor for d/c home. No further questions. Pt expressed eager to get home to 3 boxer puppies.  Sharol Harness [facility]  09/24/2011, 1:17 PM Pager: [phone]

## 2011-09-24 NOTE — Progress Notes (Signed)
ANTICOAGULATION CONSULT NOTE - Follow Up Consult  Pharmacy Consult for Coumadin Indication: VTE prophylaxis   Vital Signs: Temp: 98.4 F (36.9 C) (12/01 0526) Temp src: Oral (12/01 0526) BP: 138/78 mmHg (12/01 0526) Pulse Rate: 97  (12/01 0526)  Labs:  Basename 09/24/11 0600 09/23/11 0541 09/22/11 0550  HGB 11.5* 11.7* --  HCT 33.8* 33.8* 33.8*  PLT 123* 118* 126*  APTT -- -- --  LABPROT 18.4* 15.8* 14.1  INR 1.50* 1.23 1.07  HEPARINUNFRC -- -- --  CREATININE 1.22 1.17 1.25  CKTOTAL -- -- --  CKMB -- -- --  TROPONINI -- -- --   CrCl is unknown because there is no height on file for the current visit.   Assessment: 75 yo male s/p ortho surgery on coumadin for VTE prophylaxis. INR subtherapeutic, but trending up cbc stable, no bleeding noted.   Goal of Therapy:  INR 2-3   Plan:  Coumadin 7.5 mg x 1 today F/u INR trend  Manley Mason 09/24/2011,9:19 AM

## 2011-09-26 NOTE — Discharge Summary (Signed)
Physician Discharge Summary  Patient ID: Ian Rhodes MRN: [id number] DOB/AGE: 73-Jan-1937 73 y.o.  Admit date: 09/21/2011 Discharge date: 09/26/2011  Admission Diagnoses: Osteoarthritis hip  Discharge Diagnoses: Osteoarthritis hip Active Problems:  * No active hospital problems. *    Discharged Condition: stable  Hospital Course: Patient's hospital course was essentially unremarkable he underwent total hip arthroplasty on 09/21/2011. He progressed well with physical therapy and was discharged to home in stable condition on 09/26/2011 discharge medications included Percocet for pain Coumadin for DVT prophylaxis plan to followup in the office in 2 weeks.  Consults: none  Significant Diagnostic Studies: Routine labs were stable during his hospital stay.  Treatments: surgery: Total hip arthroplasty.  Discharge Exam: Blood pressure 138/78, pulse 97, temperature 98.4 F (36.9 C), temperature source Oral, resp. rate 18, SpO2 95.00%. Incision/Wound: incision clean and dry at time of discharge.  Disposition: Home or Self Care  Discharge Orders    Future Appointments: Provider: Department: Dept Phone: Center:   07/18/2012 7:30 AM Hayden Pedro, MD [facility] [phone] None     Future Orders Please Complete By Expires   Diet - low sodium heart healthy      Call MD / Call 911      Comments:   If you experience chest pain or shortness of breath, CALL 911 and be transported to the hospital emergency room.  If you develope a fever above 101 F, pus (white drainage) or increased drainage or redness at the wound, or calf pain, call your surgeon's office.   Constipation Prevention      Comments:   Drink plenty of fluids.  Prune juice may be helpful.  You may use a stool softener, such as Colace (over the counter) 100 mg twice a day.  Use MiraLax (over the counter) for constipation as needed.   Increase activity slowly as tolerated      Weight Bearing as taught in Physical  Therapy      Comments:   Use a walker or crutches as instructed.   Follow the hip precautions as taught in Physical Therapy      TED hose      Comments:   Use stockings (TED hose) for 12 weeks on both leg(s).  You may remove them at night for sleeping.     Discharge Medication List as of 09/24/2011  9:32 AM    START taking these medications   Details  oxyCODONE-acetaminophen (ROXICET) 5-325 MG per tablet Take 1 tablet by mouth every 4 (four) hours as needed for pain., Starting 09/23/2011, Until Mon 10/03/11, Print    warfarin (COUMADIN) 1 MG tablet Take 1 tablet (1 mg total) by mouth daily., Starting 09/23/2011, Until Sat 09/22/12, Print      CONTINUE these medications which have NOT CHANGED   Details  aspirin 81 MG tablet Take 81 mg by mouth daily.  , Until Discontinued, Historical Med    metFORMIN (GLUCOPHAGE) 1000 MG tablet Take 1,000 mg by mouth 2 (two) times daily with a meal.  , Until Discontinued, Historical Med    Multiple Vitamins-Minerals (MULTIVITAMINS THER. W/MINERALS) TABS Take 1 tablet by mouth daily.  , Until Discontinued, Historical Med    pravastatin (PRAVACHOL) 40 MG tablet Take 80 mg by mouth at bedtime.  , Until Discontinued, Historical Med       Follow-up Information    Follow up with Newt Minion, MD.   Contact information:   [address] [place] [phone]  Signed: DUDA,MARCUS V 09/26/2011, 7:09 PM

## 2011-11-22 ENCOUNTER — Encounter (INDEPENDENT_AMBULATORY_CARE_PROVIDER_SITE_OTHER): Payer: Self-pay | Admitting: Ophthalmology

## 2012-07-18 ENCOUNTER — Encounter (INDEPENDENT_AMBULATORY_CARE_PROVIDER_SITE_OTHER): Payer: Medicare Other | Admitting: Ophthalmology

## 2012-07-18 DIAGNOSIS — H3530 Unspecified macular degeneration: Secondary | ICD-10-CM

## 2012-07-18 DIAGNOSIS — H35349 Macular cyst, hole, or pseudohole, unspecified eye: Secondary | ICD-10-CM

## 2012-07-18 DIAGNOSIS — H43819 Vitreous degeneration, unspecified eye: Secondary | ICD-10-CM

## 2013-03-21 ENCOUNTER — Other Ambulatory Visit: Payer: Self-pay | Admitting: Dermatology

## 2013-04-17 ENCOUNTER — Ambulatory Visit (INDEPENDENT_AMBULATORY_CARE_PROVIDER_SITE_OTHER): Payer: Medicare Other | Admitting: Ophthalmology

## 2013-04-17 DIAGNOSIS — H35349 Macular cyst, hole, or pseudohole, unspecified eye: Secondary | ICD-10-CM

## 2013-04-17 DIAGNOSIS — H3530 Unspecified macular degeneration: Secondary | ICD-10-CM

## 2013-04-17 DIAGNOSIS — H43819 Vitreous degeneration, unspecified eye: Secondary | ICD-10-CM

## 2014-01-03 ENCOUNTER — Encounter: Payer: Self-pay | Admitting: General Surgery

## 2014-01-08 ENCOUNTER — Encounter: Payer: Self-pay | Admitting: Cardiology

## 2014-01-08 ENCOUNTER — Ambulatory Visit (INDEPENDENT_AMBULATORY_CARE_PROVIDER_SITE_OTHER): Payer: Medicare Other | Admitting: Cardiology

## 2014-01-08 VITALS — BP 116/72 | HR 89 | Ht 70.5 in | Wt 240.0 lb

## 2014-01-08 DIAGNOSIS — R079 Chest pain, unspecified: Secondary | ICD-10-CM | POA: Insufficient documentation

## 2014-01-08 NOTE — Patient Instructions (Signed)
Your physician recommends that you continue on your current medications as directed. Please refer to the Current Medication list given to you today.  Your physician has requested that you have an echocardiogram. Echocardiography is a painless test that uses sound waves to create images of your heart. It provides your doctor with information about the size and shape of your heart and how well your heart's chambers and valves are working. This procedure takes approximately one hour. There are no restrictions for this procedure.  Your physician has requested that you have a lexiscan myoview. For further information please visit [email or web address]. Please follow instruction sheet, as given.  Your physician recommends that you schedule a follow-up appointment as needed

## 2014-01-08 NOTE — Progress Notes (Signed)
[place], [place], [place]  [address] Phone: [phone] Fax:  [phone]  Date:  01/08/2014   ID:  Ian Rhodes, DOB [birth year], MRN [id number]  PCP:  Marjorie Smolder, MD  Cardiologist:  Fransico Him, MD     History of Present Illness: Ian Rhodes is a 78 y.o. male with a history of DM, dyslipidemia family history of CAD in his Dad who died in his 36's.  He presents today for evaluation of chest pain.  He says that occasionally he will get a pain that feels like a sore muscle and is nonexertional.  It occurs at any time and only a few times monthly.  This has been intermittent over the past year lasting about 5 minutes at a time.  He denies any SOB, diaphoresis or nausea.  He denies any DOE, palpitations, dizziness or syncope.   Wt Readings from Last 3 Encounters:  01/08/14 240 lb (108.863 kg)  09/14/11 248 lb 14.4 oz (112.9 kg)     Past Medical History  Diagnosis Date  . Hyperlipemia   . Diabetes mellitus     Type II  . Obesity   . Macular degeneration   . Kidney stones   . BPH (benign prostatic hypertrophy)   . Sleep apnea     very  mild not on CPAP  . Colon polyps   . DVT (deep venous thrombosis) 1995    r leg following surgery   . Carotid artery stenosis   . ED (erectile dysfunction)     Current Outpatient Prescriptions  Medication Sig Dispense Refill  . aspirin 81 MG tablet Take 81 mg by mouth daily.        . metFORMIN (GLUCOPHAGE) 1000 MG tablet Take 500 mg by mouth. 3 tablets with evening meal      . Multiple Vitamins-Minerals (MULTIVITAMINS THER. W/MINERALS) TABS Take 1 tablet by mouth daily.        . Multiple Vitamins-Minerals (PRESERVISION AREDS 2 PO) Take 2 capsules by mouth 2 (two) times daily.      . pravastatin (PRAVACHOL) 40 MG tablet Take 80 mg by mouth at bedtime.         No current facility-administered medications for this visit.    Allergies:   No Known Allergies  Social History:  The patient  reports that he  has quit smoking. He quit smokeless tobacco use about 19 years ago. He reports that he does not drink alcohol or use illicit drugs.   Family History:  The patient's family history includes CAD in his father; Heart attack in his father.   ROS:  Please see the history of present illness.      All other systems reviewed and negative.   PHYSICAL EXAM: VS:  BP 116/72  Pulse 89  Ht 5\' 5"  (1.651 m)  Wt 240 lb (108.863 kg)  BMI 39.94 kg/m2 Well nourished, well developed, in no acute distress HEENT: normal Neck: no JVD Cardiac:  normal S1, S2; RRR; no murmur Lungs:  clear to auscultation bilaterally, no wheezing, rhonchi or rales Abd: soft, nontender, no hepatomegaly Ext: no edema Skin: warm and dry Neuro:  CNs 2-12 intact, no focal abnormalities noted  EKG:  NSR with no ST changes     ASSESSMENT AND PLAN:  1. Chest pain mainly atypical but with CRF including DM/lipids and family history of CAD - continue ASA - Lexiscan myoview - he has a lot of orthopedic issues that limit his ability  to walk on a treadmill - 2D echo to assess LVF 2. DM 3. Dyslipidemia - continue Pravastatin  Followup with me PRN  Signed, Fransico Him, MD 01/08/2014 9:31 AM

## 2014-01-09 ENCOUNTER — Telehealth: Payer: Self-pay | Admitting: Cardiology

## 2014-01-09 ENCOUNTER — Encounter: Payer: Self-pay | Admitting: Cardiology

## 2014-01-09 NOTE — Telephone Encounter (Signed)
New message     Pt saw Dr Radford Pax yesterday---the height on the visit summary is incorrect.  He is 5' 10 1/2" not 5\' 5" .  He want this correct in his chart

## 2014-01-09 NOTE — Telephone Encounter (Signed)
Pt aware I will correct his height on ov yesterday Horton Chin RN

## 2014-01-16 ENCOUNTER — Ambulatory Visit (INDEPENDENT_AMBULATORY_CARE_PROVIDER_SITE_OTHER): Payer: Medicare Other | Admitting: Ophthalmology

## 2014-01-16 DIAGNOSIS — H43819 Vitreous degeneration, unspecified eye: Secondary | ICD-10-CM

## 2014-01-16 DIAGNOSIS — E11319 Type 2 diabetes mellitus with unspecified diabetic retinopathy without macular edema: Secondary | ICD-10-CM

## 2014-01-16 DIAGNOSIS — E1139 Type 2 diabetes mellitus with other diabetic ophthalmic complication: Secondary | ICD-10-CM

## 2014-01-16 DIAGNOSIS — E1165 Type 2 diabetes mellitus with hyperglycemia: Secondary | ICD-10-CM

## 2014-01-16 DIAGNOSIS — H3530 Unspecified macular degeneration: Secondary | ICD-10-CM

## 2014-01-16 DIAGNOSIS — H35349 Macular cyst, hole, or pseudohole, unspecified eye: Secondary | ICD-10-CM

## 2014-01-22 ENCOUNTER — Ambulatory Visit (HOSPITAL_COMMUNITY): Payer: Medicare Other | Attending: Cardiology | Admitting: Radiology

## 2014-01-22 ENCOUNTER — Ambulatory Visit (HOSPITAL_BASED_OUTPATIENT_CLINIC_OR_DEPARTMENT_OTHER): Payer: Medicare Other | Admitting: Radiology

## 2014-01-22 VITALS — BP 134/75 | HR 68 | Ht 70.5 in | Wt 240.0 lb

## 2014-01-22 DIAGNOSIS — Z87891 Personal history of nicotine dependence: Secondary | ICD-10-CM | POA: Insufficient documentation

## 2014-01-22 DIAGNOSIS — R0609 Other forms of dyspnea: Secondary | ICD-10-CM | POA: Insufficient documentation

## 2014-01-22 DIAGNOSIS — Z8249 Family history of ischemic heart disease and other diseases of the circulatory system: Secondary | ICD-10-CM | POA: Insufficient documentation

## 2014-01-22 DIAGNOSIS — R0989 Other specified symptoms and signs involving the circulatory and respiratory systems: Secondary | ICD-10-CM | POA: Insufficient documentation

## 2014-01-22 DIAGNOSIS — R079 Chest pain, unspecified: Secondary | ICD-10-CM

## 2014-01-22 DIAGNOSIS — I779 Disorder of arteries and arterioles, unspecified: Secondary | ICD-10-CM | POA: Insufficient documentation

## 2014-01-22 DIAGNOSIS — R072 Precordial pain: Secondary | ICD-10-CM

## 2014-01-22 DIAGNOSIS — E119 Type 2 diabetes mellitus without complications: Secondary | ICD-10-CM | POA: Insufficient documentation

## 2014-01-22 DIAGNOSIS — R0789 Other chest pain: Secondary | ICD-10-CM | POA: Insufficient documentation

## 2014-01-22 MED ORDER — TECHNETIUM TC 99M SESTAMIBI GENERIC - CARDIOLITE
10.0000 | Freq: Once | INTRAVENOUS | Status: AC | PRN
  Administered 2014-01-22: 10 via INTRAVENOUS

## 2014-01-22 MED ORDER — TECHNETIUM TC 99M SESTAMIBI GENERIC - CARDIOLITE
30.0000 | Freq: Once | INTRAVENOUS | Status: AC | PRN
  Administered 2014-01-22: 30 via INTRAVENOUS

## 2014-01-22 MED ORDER — REGADENOSON 0.4 MG/5ML IV SOLN
0.4000 mg | Freq: Once | INTRAVENOUS | Status: AC
  Administered 2014-01-22: 0.4 mg via INTRAVENOUS

## 2014-01-22 NOTE — Progress Notes (Signed)
[facility] NUCLEAR MED [address] [place], [place] [address] [phone]    Cardiology Nuclear Med Study  Ian Rhodes is a 78 y.o. male     MRN : [id number]     DOB: 05/03/1936  Procedure Date: 01/22/2014  Nuclear Med Background Indication for Stress Test:  Evaluation for Ischemia History:  No Hx of CAD Cardiac Risk Factors: Carotid Disease, Family History - CAD, History of Smoking, Lipids and NIDDM  Symptoms:  Chest Pain (last date of chest discomfort was one week ago)   Nuclear Pre-Procedure Caffeine/Decaff Intake:  None NPO After: 9:00pm   Lungs:  clear O2 Sat: 96% on room air. IV 0.9% NS with Angio Cath:  22g  IV Site: R Antecubital  IV Started by:  Perrin Maltese, EMT-P  Chest Size (in):  46 Cup Size: n/a  Height: 5' 10.5" (1.791 m)  Weight:  240 lb (108.863 kg)  BMI:  Body mass index is 33.94 kg/(m^2). Tech Comments:  n/a    Nuclear Med Study 1 or 2 day study: 1 day  Stress Test Type:  Carlton Adam  Reading MD: n/a  Order Authorizing Provider:  T.Turner MD  Resting Radionuclide: Technetium 14m Sestamibi  Resting Radionuclide Dose: 11.0 mCi   Stress Radionuclide:  Technetium 54m Sestamibi  Stress Radionuclide Dose: 33.0 mCi           Stress Protocol Rest HR: 68 Stress HR: 112  Rest BP: 134/75 Stress BP: 155/87  Exercise Time (min): n/a METS: n/a           Dose of Adenosine (mg):  n/a Dose of Lexiscan: 0.4 mg  Dose of Atropine (mg): n/a Dose of Dobutamine: n/a mcg/kg/min (at max HR)  Stress Test Technologist: Glade Lloyd, BS-ES  Nuclear Technologist:  Charlton Amor, CNMT     Rest Procedure:  Myocardial perfusion imaging was performed at rest 45 minutes following the intravenous administration of Technetium 64m Sestamibi. Rest ECG: NSR - Normal EKG  Stress Procedure:  The patient received IV Lexiscan 0.4 mg over 15-seconds with concurrent low level exercise and then Technetium 11m Sestamibi was injected at 30-seconds while the  patient continued walking one more minute.  Quantitative spect images were obtained after a 45-minute delay.  During the infusion of Lexiscan, the patient complained of slight SOB.  This resolved in recovery.  Stress ECG: No significant change from baseline ECG  QPS Raw Data Images:  Normal; no motion artifact; normal heart/lung ratio. Stress Images:  Normal homogeneous uptake in all areas of the myocardium. Rest Images:  Normal homogeneous uptake in all areas of the myocardium. Subtraction (SDS):  No evidence of ischemia. Transient Ischemic Dilatation (Normal <1.22):  0.98 Lung/Heart Ratio (Normal <0.45):  0.39  Quantitative Gated Spect Images QGS EDV:  73 ml QGS ESV:  30 ml  Impression Exercise Capacity:  Lexiscan with low level exercise. BP Response:  Normal blood pressure response. Clinical Symptoms:  There is dyspnea. ECG Impression:  No significant ST segment change suggestive of ischemia. Comparison with Prior Nuclear Study: No images to compare  Overall Impression:  Normal stress nuclear study.  LV Ejection Fraction: 59%.  LV Wall Motion:  NL LV Function; NL Wall Motion  Ian Rhodes 01/22/2014

## 2014-01-22 NOTE — Progress Notes (Signed)
Echocardiogram performed.  

## 2014-01-27 ENCOUNTER — Telehealth: Payer: Self-pay | Admitting: Cardiology

## 2014-01-27 NOTE — Telephone Encounter (Signed)
New message     Want echo and stress test results

## 2014-01-27 NOTE — Telephone Encounter (Signed)
Notified of stress test results. 

## 2014-01-27 NOTE — Telephone Encounter (Signed)
New message ° ° ° ° °

## 2014-10-20 ENCOUNTER — Ambulatory Visit (INDEPENDENT_AMBULATORY_CARE_PROVIDER_SITE_OTHER): Payer: Medicare Other | Admitting: Ophthalmology

## 2014-10-20 DIAGNOSIS — H3531 Nonexudative age-related macular degeneration: Secondary | ICD-10-CM

## 2014-10-20 DIAGNOSIS — E10319 Type 1 diabetes mellitus with unspecified diabetic retinopathy without macular edema: Secondary | ICD-10-CM

## 2014-10-20 DIAGNOSIS — H43811 Vitreous degeneration, right eye: Secondary | ICD-10-CM

## 2014-10-20 DIAGNOSIS — E11329 Type 2 diabetes mellitus with mild nonproliferative diabetic retinopathy without macular edema: Secondary | ICD-10-CM

## 2014-10-20 DIAGNOSIS — H35342 Macular cyst, hole, or pseudohole, left eye: Secondary | ICD-10-CM

## 2015-07-22 ENCOUNTER — Ambulatory Visit (INDEPENDENT_AMBULATORY_CARE_PROVIDER_SITE_OTHER): Payer: Medicare Other | Admitting: Ophthalmology

## 2015-07-22 DIAGNOSIS — H3531 Nonexudative age-related macular degeneration: Secondary | ICD-10-CM | POA: Diagnosis not present

## 2015-07-22 DIAGNOSIS — H35342 Macular cyst, hole, or pseudohole, left eye: Secondary | ICD-10-CM | POA: Diagnosis not present

## 2015-07-22 DIAGNOSIS — H43811 Vitreous degeneration, right eye: Secondary | ICD-10-CM | POA: Diagnosis not present

## 2015-08-28 ENCOUNTER — Other Ambulatory Visit: Payer: Self-pay | Admitting: Nephrology

## 2015-08-28 DIAGNOSIS — N184 Chronic kidney disease, stage 4 (severe): Secondary | ICD-10-CM

## 2015-09-07 ENCOUNTER — Ambulatory Visit
Admission: RE | Admit: 2015-09-07 | Discharge: 2015-09-07 | Disposition: A | Discharge status: Home / Self Care | Payer: Medicare Other | Source: Ambulatory Visit | Attending: Nephrology | Admitting: Nephrology

## 2015-09-07 ENCOUNTER — Other Ambulatory Visit: Payer: Self-pay

## 2015-09-07 DIAGNOSIS — N184 Chronic kidney disease, stage 4 (severe): Secondary | ICD-10-CM

## 2015-11-09 DIAGNOSIS — E1122 Type 2 diabetes mellitus with diabetic chronic kidney disease: Secondary | ICD-10-CM | POA: Diagnosis not present

## 2015-11-09 DIAGNOSIS — N200 Calculus of kidney: Secondary | ICD-10-CM | POA: Diagnosis not present

## 2015-11-18 DIAGNOSIS — Z0000 Encounter for general adult medical examination without abnormal findings: Secondary | ICD-10-CM | POA: Diagnosis not present

## 2015-11-18 DIAGNOSIS — N200 Calculus of kidney: Secondary | ICD-10-CM | POA: Diagnosis not present

## 2015-11-19 DIAGNOSIS — J200 Acute bronchitis due to Mycoplasma pneumoniae: Secondary | ICD-10-CM | POA: Diagnosis not present

## 2015-12-11 DIAGNOSIS — N184 Chronic kidney disease, stage 4 (severe): Secondary | ICD-10-CM | POA: Diagnosis not present

## 2015-12-11 DIAGNOSIS — E669 Obesity, unspecified: Secondary | ICD-10-CM | POA: Diagnosis not present

## 2015-12-11 DIAGNOSIS — I82409 Acute embolism and thrombosis of unspecified deep veins of unspecified lower extremity: Secondary | ICD-10-CM | POA: Diagnosis not present

## 2015-12-11 DIAGNOSIS — E785 Hyperlipidemia, unspecified: Secondary | ICD-10-CM | POA: Diagnosis not present

## 2015-12-11 DIAGNOSIS — E1122 Type 2 diabetes mellitus with diabetic chronic kidney disease: Secondary | ICD-10-CM | POA: Diagnosis not present

## 2015-12-11 DIAGNOSIS — N2581 Secondary hyperparathyroidism of renal origin: Secondary | ICD-10-CM | POA: Diagnosis not present

## 2015-12-11 DIAGNOSIS — D631 Anemia in chronic kidney disease: Secondary | ICD-10-CM | POA: Diagnosis not present

## 2015-12-11 DIAGNOSIS — E118 Type 2 diabetes mellitus with unspecified complications: Secondary | ICD-10-CM | POA: Diagnosis not present

## 2015-12-11 DIAGNOSIS — N529 Male erectile dysfunction, unspecified: Secondary | ICD-10-CM | POA: Diagnosis not present

## 2015-12-11 DIAGNOSIS — N200 Calculus of kidney: Secondary | ICD-10-CM | POA: Diagnosis not present

## 2015-12-11 DIAGNOSIS — I6529 Occlusion and stenosis of unspecified carotid artery: Secondary | ICD-10-CM | POA: Diagnosis not present

## 2015-12-11 DIAGNOSIS — N400 Enlarged prostate without lower urinary tract symptoms: Secondary | ICD-10-CM | POA: Diagnosis not present

## 2016-02-09 DIAGNOSIS — R6889 Other general symptoms and signs: Secondary | ICD-10-CM | POA: Diagnosis not present

## 2016-02-15 DIAGNOSIS — N184 Chronic kidney disease, stage 4 (severe): Secondary | ICD-10-CM | POA: Diagnosis not present

## 2016-02-15 DIAGNOSIS — E669 Obesity, unspecified: Secondary | ICD-10-CM | POA: Diagnosis not present

## 2016-02-15 DIAGNOSIS — E1122 Type 2 diabetes mellitus with diabetic chronic kidney disease: Secondary | ICD-10-CM | POA: Diagnosis not present

## 2016-02-18 ENCOUNTER — Encounter: Payer: Self-pay | Admitting: Dietician

## 2016-02-18 ENCOUNTER — Encounter: Payer: PPO | Attending: Family Medicine | Admitting: Dietician

## 2016-02-18 VITALS — Ht 72.0 in | Wt 234.0 lb

## 2016-02-18 DIAGNOSIS — E1122 Type 2 diabetes mellitus with diabetic chronic kidney disease: Secondary | ICD-10-CM | POA: Diagnosis not present

## 2016-02-18 DIAGNOSIS — N184 Chronic kidney disease, stage 4 (severe): Secondary | ICD-10-CM

## 2016-02-18 DIAGNOSIS — E119 Type 2 diabetes mellitus without complications: Secondary | ICD-10-CM

## 2016-02-18 NOTE — Patient Instructions (Signed)
Continue to be active most days. Avoid processed meat. Continue the low oxalate and sodium diet. Get adequate water intake. Avoid additional restrictions unless recommended by the doctor.

## 2016-02-18 NOTE — Progress Notes (Signed)
  Medical Nutrition Therapy:  Appt start time: 0930 end time:  1100.   Assessment:  Primary concerns today: Patient is here today with his wife.  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  Patient has Stage 4 CKD with hx of kidney stones and she has questions regarding the combination of all of the diet requirements (low oxylate, kidney, and diabetic).  She has been limiting his potassium and phosphorus intake.  His potassium was WNL on lab check.  HgbA1C of 6.2% 02/15/16 and this has been stable.  GFR 32,   Other hx includes hyperlipidemia and history of OSA (mild per patient, lost weight and does not wear c-pap).    Weight hx: Highest:  250 lbs about 1 year ago. 234 lbs today Reports stopping smoking in 1995 and gaining weight after this.  Patient lives with his wife.  She does the shopping and cooking.  He is retired.  Preferred Learning Style:   No preference indicated   Learning Readiness:   Ready  Change in progress  MEDICATIONS: see list.  He has been taken off of the Metformin due to CKD.     DIETARY INTAKE:  Usual eating pattern includes 3 meals and 0 snacks per day.  Avoided foods include added salt.  Does eat out at times.  Uses processed meat.    24-hr recall:  B ( AM): 4 ounces milk, plain cheerios, 1 slice white toast with jelly (no sugar) OR waffles, sugar free syrup and eggs  Snk ( AM):  none L ( PM): bologna sandwich Snk ( PM): none D ( PM): salad with chicken OR chicken and dumplings OR chicken and leached potatoes, lower potassium vegetables Snk ( PM): none Beverages: water, crystal light iced tea  Usual physical activity: yard work, walks dogs  Estimated energy needs: 2000 calories 80 g protein  Progress Towards Goal(s):  In progress.   Nutritional Diagnosis:  NB-1.1 Food and nutrition-related knowledge deficit As related to balance of carbohydrate, protein, and fat.  As evidenced by diet hx and patient report.    Intervention:  Nutrition  counseling/education related to a diet for CKD.  Discussed need for sodium restriction and other restrictions based on MD due to normal potassium and no need to over restrict if not needed.  Discussed low phosphorus guidelines.  Discussed low oxalate diet.  Discussed the guidelines of a diabetic diet and how this fits into other restrictions.  Continue to be active most days. Avoid processed meat. Continue the low oxalate and sodium diet. Get adequate water intake. Avoid additional restrictions unless recommended by the doctor.  Teaching Method Utilized:  Visual Auditory Hands on  Handouts given during visit include:  Nutrition Therapy for Kidney Stones from AND  Choose a meal booklet  Low protein meals  Kidney pyramid  Label reading  Barriers to learning/adherence to lifestyle change: none  Demonstrated degree of understanding via:  Teach Back   Monitoring/Evaluation:  Dietary intake, exercise, label reading, and body weight prn.

## 2016-02-23 DIAGNOSIS — J329 Chronic sinusitis, unspecified: Secondary | ICD-10-CM | POA: Diagnosis not present

## 2016-04-18 DIAGNOSIS — M25512 Pain in left shoulder: Secondary | ICD-10-CM | POA: Diagnosis not present

## 2016-04-20 ENCOUNTER — Ambulatory Visit (INDEPENDENT_AMBULATORY_CARE_PROVIDER_SITE_OTHER): Payer: PPO | Admitting: Ophthalmology

## 2016-04-20 DIAGNOSIS — H353112 Nonexudative age-related macular degeneration, right eye, intermediate dry stage: Secondary | ICD-10-CM

## 2016-04-20 DIAGNOSIS — H35343 Macular cyst, hole, or pseudohole, bilateral: Secondary | ICD-10-CM

## 2016-04-20 DIAGNOSIS — H43811 Vitreous degeneration, right eye: Secondary | ICD-10-CM | POA: Diagnosis not present

## 2016-04-22 DIAGNOSIS — N200 Calculus of kidney: Secondary | ICD-10-CM | POA: Diagnosis not present

## 2016-04-22 DIAGNOSIS — I6529 Occlusion and stenosis of unspecified carotid artery: Secondary | ICD-10-CM | POA: Diagnosis not present

## 2016-04-22 DIAGNOSIS — N529 Male erectile dysfunction, unspecified: Secondary | ICD-10-CM | POA: Diagnosis not present

## 2016-04-22 DIAGNOSIS — N400 Enlarged prostate without lower urinary tract symptoms: Secondary | ICD-10-CM | POA: Diagnosis not present

## 2016-04-22 DIAGNOSIS — G4730 Sleep apnea, unspecified: Secondary | ICD-10-CM | POA: Diagnosis not present

## 2016-04-22 DIAGNOSIS — D631 Anemia in chronic kidney disease: Secondary | ICD-10-CM | POA: Diagnosis not present

## 2016-04-22 DIAGNOSIS — I82409 Acute embolism and thrombosis of unspecified deep veins of unspecified lower extremity: Secondary | ICD-10-CM | POA: Diagnosis not present

## 2016-04-22 DIAGNOSIS — E785 Hyperlipidemia, unspecified: Secondary | ICD-10-CM | POA: Diagnosis not present

## 2016-04-22 DIAGNOSIS — E118 Type 2 diabetes mellitus with unspecified complications: Secondary | ICD-10-CM | POA: Diagnosis not present

## 2016-04-22 DIAGNOSIS — E1122 Type 2 diabetes mellitus with diabetic chronic kidney disease: Secondary | ICD-10-CM | POA: Diagnosis not present

## 2016-04-22 DIAGNOSIS — N2581 Secondary hyperparathyroidism of renal origin: Secondary | ICD-10-CM | POA: Diagnosis not present

## 2016-04-22 DIAGNOSIS — N184 Chronic kidney disease, stage 4 (severe): Secondary | ICD-10-CM | POA: Diagnosis not present

## 2016-05-24 DIAGNOSIS — M75122 Complete rotator cuff tear or rupture of left shoulder, not specified as traumatic: Secondary | ICD-10-CM | POA: Diagnosis not present

## 2016-05-24 DIAGNOSIS — M24112 Other articular cartilage disorders, left shoulder: Secondary | ICD-10-CM | POA: Diagnosis not present

## 2016-05-24 DIAGNOSIS — M24012 Loose body in left shoulder: Secondary | ICD-10-CM | POA: Diagnosis not present

## 2016-05-24 DIAGNOSIS — M75102 Unspecified rotator cuff tear or rupture of left shoulder, not specified as traumatic: Secondary | ICD-10-CM | POA: Diagnosis not present

## 2016-05-24 DIAGNOSIS — G8918 Other acute postprocedural pain: Secondary | ICD-10-CM | POA: Diagnosis not present

## 2016-05-24 DIAGNOSIS — M7542 Impingement syndrome of left shoulder: Secondary | ICD-10-CM | POA: Diagnosis not present

## 2016-05-24 DIAGNOSIS — M7552 Bursitis of left shoulder: Secondary | ICD-10-CM | POA: Diagnosis not present

## 2016-07-14 DIAGNOSIS — Z0000 Encounter for general adult medical examination without abnormal findings: Secondary | ICD-10-CM | POA: Diagnosis not present

## 2016-07-14 DIAGNOSIS — E1122 Type 2 diabetes mellitus with diabetic chronic kidney disease: Secondary | ICD-10-CM | POA: Diagnosis not present

## 2016-07-14 DIAGNOSIS — D692 Other nonthrombocytopenic purpura: Secondary | ICD-10-CM | POA: Diagnosis not present

## 2016-07-14 DIAGNOSIS — E669 Obesity, unspecified: Secondary | ICD-10-CM | POA: Diagnosis not present

## 2016-07-14 DIAGNOSIS — E7800 Pure hypercholesterolemia, unspecified: Secondary | ICD-10-CM | POA: Diagnosis not present

## 2016-07-14 DIAGNOSIS — I6521 Occlusion and stenosis of right carotid artery: Secondary | ICD-10-CM | POA: Diagnosis not present

## 2016-07-14 DIAGNOSIS — Z23 Encounter for immunization: Secondary | ICD-10-CM | POA: Diagnosis not present

## 2016-07-15 ENCOUNTER — Other Ambulatory Visit: Payer: Self-pay | Admitting: Family Medicine

## 2016-07-15 DIAGNOSIS — I6521 Occlusion and stenosis of right carotid artery: Secondary | ICD-10-CM

## 2016-07-20 ENCOUNTER — Ambulatory Visit
Admission: RE | Admit: 2016-07-20 | Discharge: 2016-07-20 | Disposition: A | Discharge status: Home / Self Care | Payer: PPO | Source: Ambulatory Visit | Attending: Family Medicine | Admitting: Family Medicine

## 2016-07-20 DIAGNOSIS — I6523 Occlusion and stenosis of bilateral carotid arteries: Secondary | ICD-10-CM | POA: Diagnosis not present

## 2016-07-20 DIAGNOSIS — I6521 Occlusion and stenosis of right carotid artery: Secondary | ICD-10-CM

## 2016-07-22 ENCOUNTER — Other Ambulatory Visit: Payer: Self-pay | Admitting: Family Medicine

## 2016-07-22 DIAGNOSIS — I351 Nonrheumatic aortic (valve) insufficiency: Secondary | ICD-10-CM

## 2016-07-26 DIAGNOSIS — N2581 Secondary hyperparathyroidism of renal origin: Secondary | ICD-10-CM | POA: Diagnosis not present

## 2016-07-26 DIAGNOSIS — E785 Hyperlipidemia, unspecified: Secondary | ICD-10-CM | POA: Diagnosis not present

## 2016-07-26 DIAGNOSIS — N200 Calculus of kidney: Secondary | ICD-10-CM | POA: Diagnosis not present

## 2016-07-26 DIAGNOSIS — E118 Type 2 diabetes mellitus with unspecified complications: Secondary | ICD-10-CM | POA: Diagnosis not present

## 2016-07-26 DIAGNOSIS — N184 Chronic kidney disease, stage 4 (severe): Secondary | ICD-10-CM | POA: Diagnosis not present

## 2016-07-26 DIAGNOSIS — D631 Anemia in chronic kidney disease: Secondary | ICD-10-CM | POA: Diagnosis not present

## 2016-07-26 DIAGNOSIS — E1122 Type 2 diabetes mellitus with diabetic chronic kidney disease: Secondary | ICD-10-CM | POA: Diagnosis not present

## 2016-07-26 DIAGNOSIS — I6529 Occlusion and stenosis of unspecified carotid artery: Secondary | ICD-10-CM | POA: Diagnosis not present

## 2016-07-26 DIAGNOSIS — I82409 Acute embolism and thrombosis of unspecified deep veins of unspecified lower extremity: Secondary | ICD-10-CM | POA: Diagnosis not present

## 2016-07-26 DIAGNOSIS — G4730 Sleep apnea, unspecified: Secondary | ICD-10-CM | POA: Diagnosis not present

## 2016-07-26 DIAGNOSIS — N400 Enlarged prostate without lower urinary tract symptoms: Secondary | ICD-10-CM | POA: Diagnosis not present

## 2016-07-26 DIAGNOSIS — E669 Obesity, unspecified: Secondary | ICD-10-CM | POA: Diagnosis not present

## 2016-08-04 ENCOUNTER — Encounter (INDEPENDENT_AMBULATORY_CARE_PROVIDER_SITE_OTHER): Payer: Self-pay

## 2016-08-04 ENCOUNTER — Other Ambulatory Visit: Payer: Self-pay

## 2016-08-04 ENCOUNTER — Ambulatory Visit (HOSPITAL_COMMUNITY): Payer: PPO | Attending: Cardiology

## 2016-08-04 DIAGNOSIS — E785 Hyperlipidemia, unspecified: Secondary | ICD-10-CM | POA: Insufficient documentation

## 2016-08-04 DIAGNOSIS — I351 Nonrheumatic aortic (valve) insufficiency: Secondary | ICD-10-CM | POA: Insufficient documentation

## 2016-08-04 DIAGNOSIS — I358 Other nonrheumatic aortic valve disorders: Secondary | ICD-10-CM | POA: Insufficient documentation

## 2016-08-04 DIAGNOSIS — E119 Type 2 diabetes mellitus without complications: Secondary | ICD-10-CM | POA: Insufficient documentation

## 2016-08-04 DIAGNOSIS — I359 Nonrheumatic aortic valve disorder, unspecified: Secondary | ICD-10-CM | POA: Diagnosis not present

## 2016-08-04 DIAGNOSIS — Z8249 Family history of ischemic heart disease and other diseases of the circulatory system: Secondary | ICD-10-CM | POA: Insufficient documentation

## 2016-08-04 DIAGNOSIS — I071 Rheumatic tricuspid insufficiency: Secondary | ICD-10-CM | POA: Diagnosis not present

## 2016-11-29 DIAGNOSIS — E118 Type 2 diabetes mellitus with unspecified complications: Secondary | ICD-10-CM | POA: Diagnosis not present

## 2016-11-29 DIAGNOSIS — E785 Hyperlipidemia, unspecified: Secondary | ICD-10-CM | POA: Diagnosis not present

## 2016-11-29 DIAGNOSIS — I82409 Acute embolism and thrombosis of unspecified deep veins of unspecified lower extremity: Secondary | ICD-10-CM | POA: Diagnosis not present

## 2016-11-29 DIAGNOSIS — E1122 Type 2 diabetes mellitus with diabetic chronic kidney disease: Secondary | ICD-10-CM | POA: Diagnosis not present

## 2016-11-29 DIAGNOSIS — G4730 Sleep apnea, unspecified: Secondary | ICD-10-CM | POA: Diagnosis not present

## 2016-11-29 DIAGNOSIS — N400 Enlarged prostate without lower urinary tract symptoms: Secondary | ICD-10-CM | POA: Diagnosis not present

## 2016-11-29 DIAGNOSIS — N200 Calculus of kidney: Secondary | ICD-10-CM | POA: Diagnosis not present

## 2016-11-29 DIAGNOSIS — D631 Anemia in chronic kidney disease: Secondary | ICD-10-CM | POA: Diagnosis not present

## 2016-11-29 DIAGNOSIS — I6529 Occlusion and stenosis of unspecified carotid artery: Secondary | ICD-10-CM | POA: Diagnosis not present

## 2016-11-29 DIAGNOSIS — N184 Chronic kidney disease, stage 4 (severe): Secondary | ICD-10-CM | POA: Diagnosis not present

## 2016-11-29 DIAGNOSIS — N2581 Secondary hyperparathyroidism of renal origin: Secondary | ICD-10-CM | POA: Diagnosis not present

## 2016-11-29 DIAGNOSIS — E669 Obesity, unspecified: Secondary | ICD-10-CM | POA: Diagnosis not present

## 2016-12-22 DIAGNOSIS — R05 Cough: Secondary | ICD-10-CM | POA: Diagnosis not present

## 2016-12-22 DIAGNOSIS — J329 Chronic sinusitis, unspecified: Secondary | ICD-10-CM | POA: Diagnosis not present

## 2017-01-12 DIAGNOSIS — J0100 Acute maxillary sinusitis, unspecified: Secondary | ICD-10-CM | POA: Diagnosis not present

## 2017-01-16 IMAGING — US US CAROTID DUPLEX BILAT
1 series · 13 of 24 positions shown · non-contrast
Comparison: None.

CLINICAL DATA: 80-year-old male with right carotid artery stenosis

EXAM:
BILATERAL CAROTID DUPLEX ULTRASOUND
TECHNIQUE: Gray scale imaging, color Doppler and duplex ultrasound were
performed of bilateral carotid and vertebral arteries in the neck.

[Series 1: us carotid duplex bilat · 0.08mm/px · 13 of 71 slices shown]
[im 1/71]
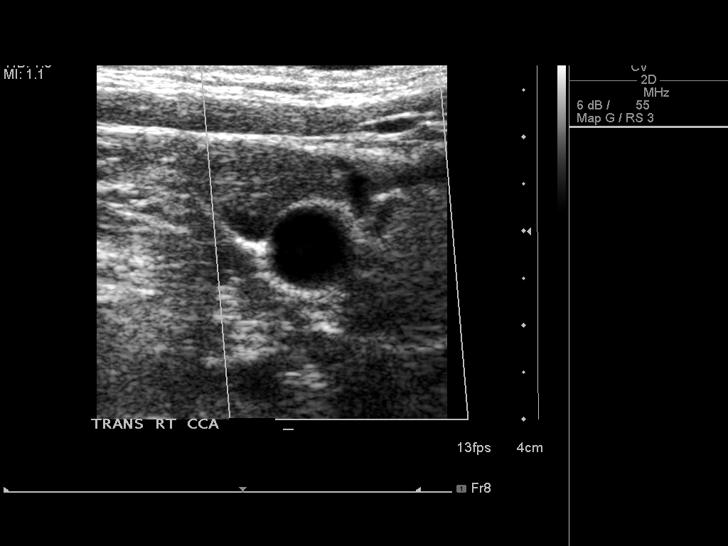
[im 7/71]
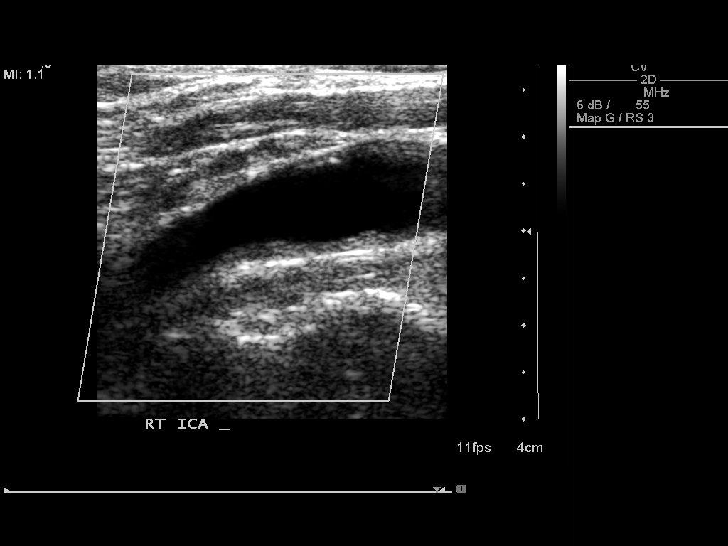
[im 13/71]
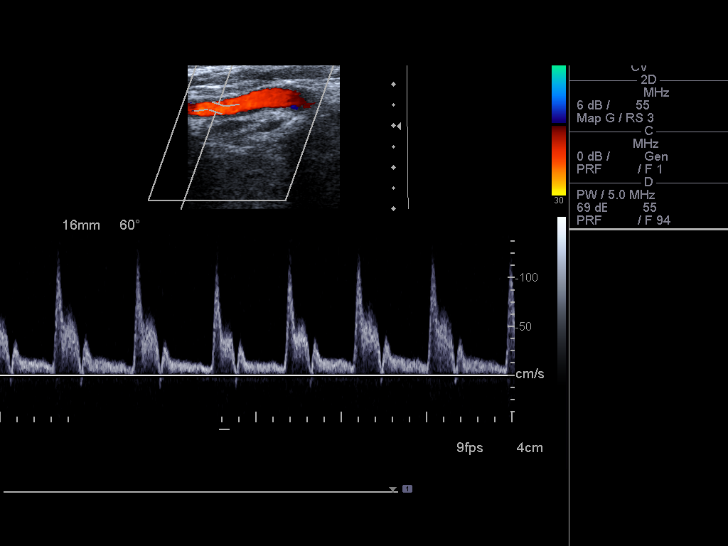
[im 19/71]
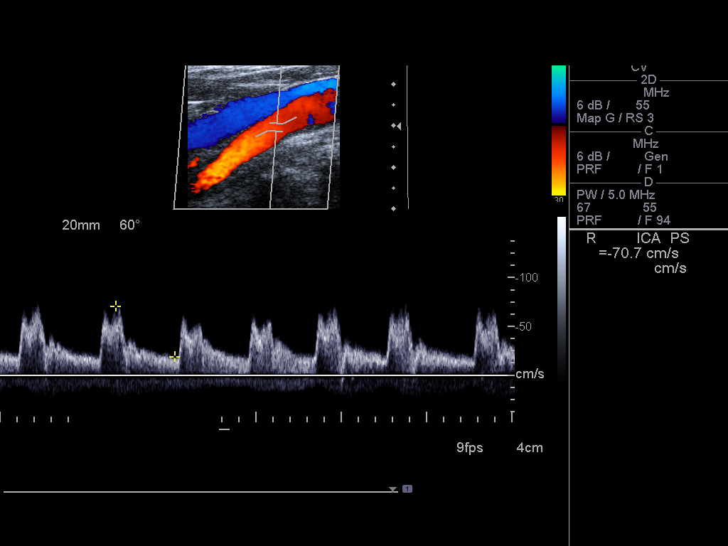
[im 25/71]
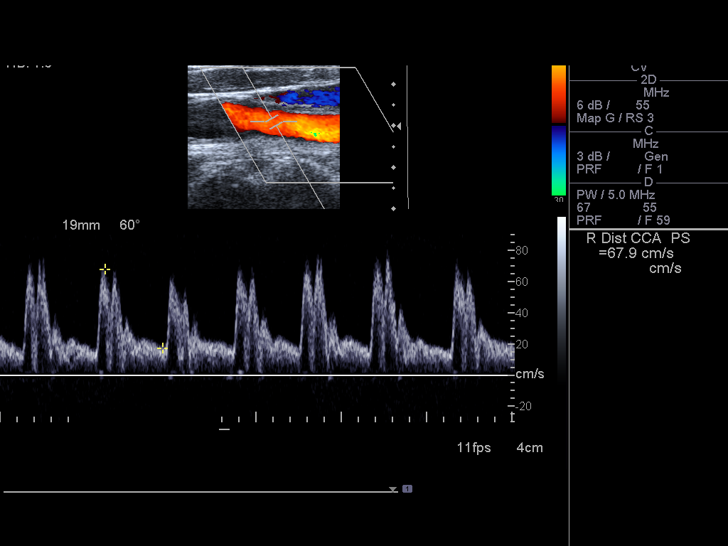
[im 31/71]
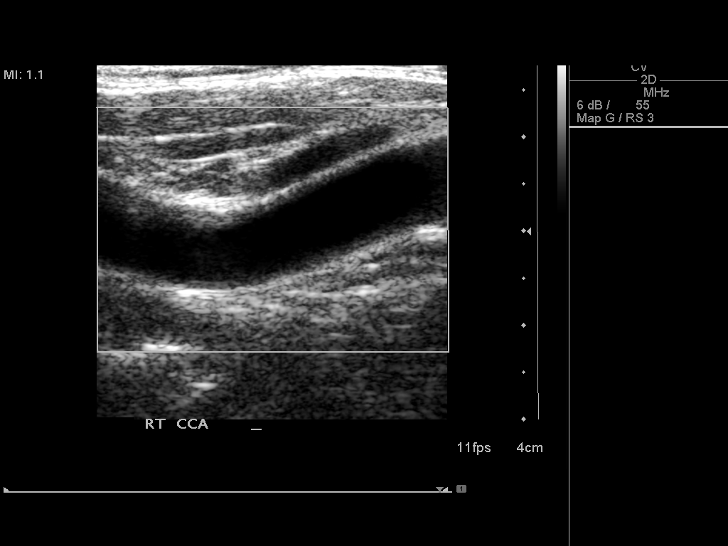
[im 37/71]
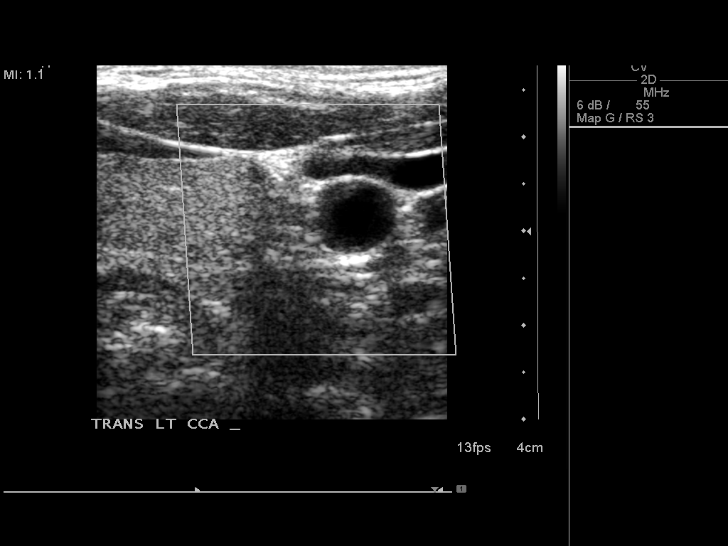
[im 40/71]
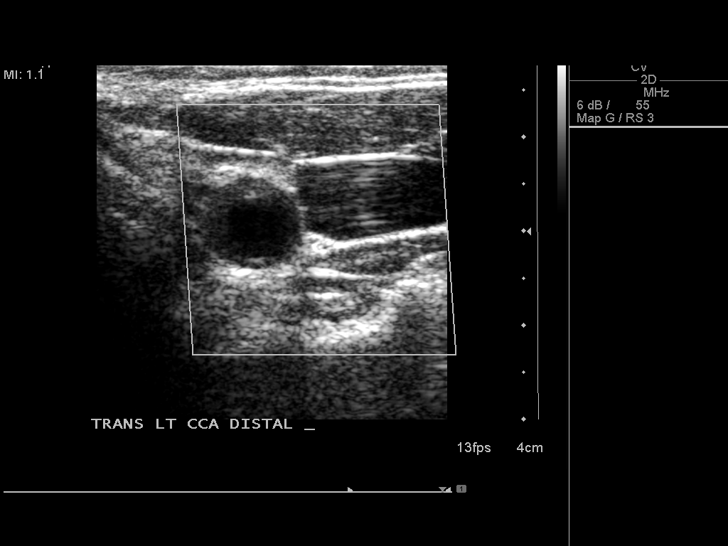
[im 46/71]
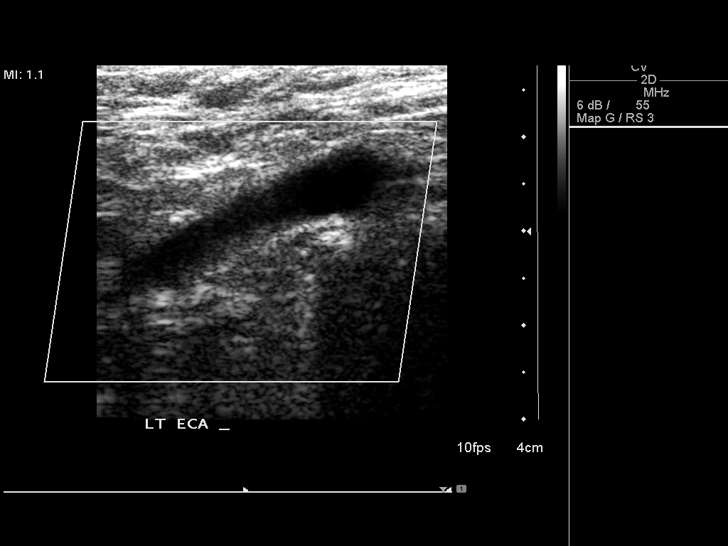
[im 52/71]
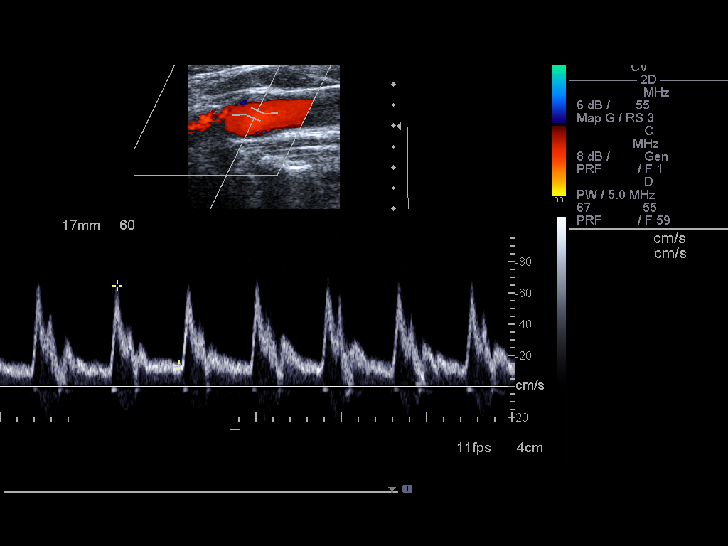
[im 58/71]
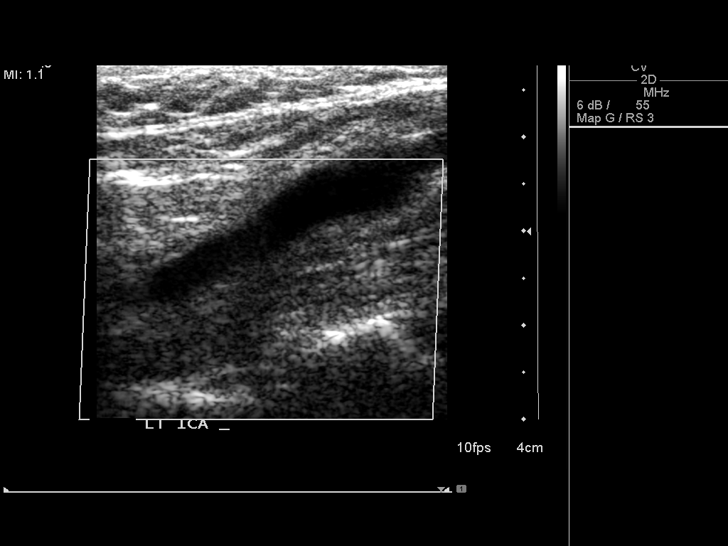
[im 64/71]
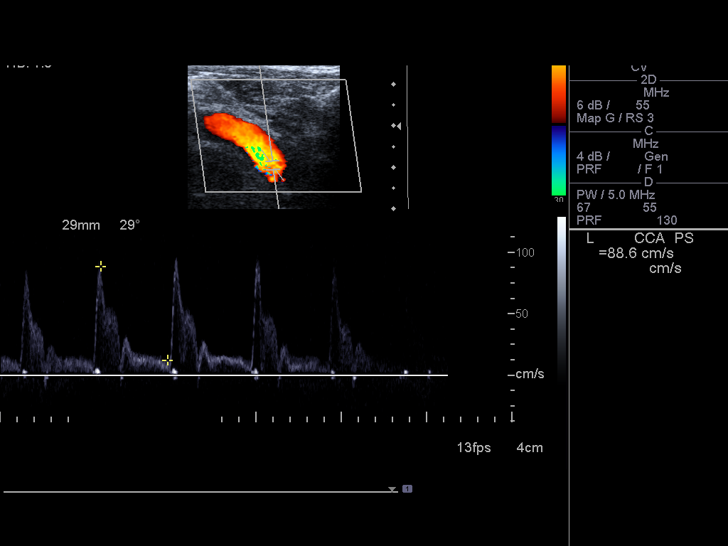
[im 71/71]
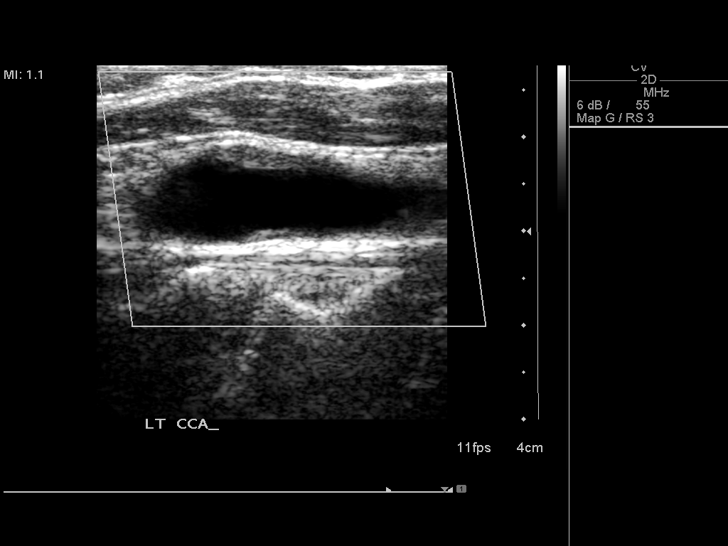

[13 of 24 positions shown; findings below may reference images not displayed]

FINDINGS: Criteria: Quantification of carotid stenosis is based on velocity
parameters that correlate the residual internal carotid diameter
with NASCET-based stenosis levels, using the diameter of the distal
internal carotid lumen as the denominator for stenosis measurement.

The following velocity measurements were obtained:

RIGHT

ICA:  85/23 cm/sec

CCA:  85/16 cm/sec

SYSTOLIC ICA/CCA RATIO:

DIASTOLIC ICA/CCA RATIO:

ECA:  128 cm/sec

LEFT

ICA:  82/27 cm/sec

CCA:  89/12 cm/sec

SYSTOLIC ICA/CCA RATIO:

DIASTOLIC ICA/CCA RATIO:

ECA:  100 thirds cm/sec

RIGHT CAROTID ARTERY: Trace heterogeneous atherosclerotic plaque in
the proximal internal carotid artery. By peak systolic velocity
criteria the estimated stenosis is less than 50%. Spectral arterial
waveform demonstrates a biphasic peak suggestive pulsus bisferiens.

RIGHT VERTEBRAL ARTERY:  Patent with antegrade flow.

LEFT CAROTID ARTERY: Trace heterogeneous atherosclerotic plaque. By
peak systolic velocity criteria the estimated stenosis remains less
than 50%. Pulsus bisferiens again noted.

LEFT VERTEBRAL ARTERY:  Patent with normal antegrade flow.
IMPRESSION: 1. Mild (1-49%) stenosis proximal right internal carotid artery
secondary to heterogenous atherosclerotic plaque.
2. Mild (1-49%) stenosis proximal left internal carotid artery
secondary to heterogenous atherosclerotic plaque.
3. Vertebral arteries are patent with antegrade flow.
4. Pulsus bisferiens arterial waveform which can be seen in the
setting of aortic regurgitation.

## 2017-01-18 ENCOUNTER — Ambulatory Visit (INDEPENDENT_AMBULATORY_CARE_PROVIDER_SITE_OTHER): Payer: PPO | Admitting: Ophthalmology

## 2017-01-18 DIAGNOSIS — H35342 Macular cyst, hole, or pseudohole, left eye: Secondary | ICD-10-CM

## 2017-01-18 DIAGNOSIS — H353111 Nonexudative age-related macular degeneration, right eye, early dry stage: Secondary | ICD-10-CM

## 2017-01-18 DIAGNOSIS — H43811 Vitreous degeneration, right eye: Secondary | ICD-10-CM

## 2017-01-23 DIAGNOSIS — R51 Headache: Secondary | ICD-10-CM | POA: Diagnosis not present

## 2017-01-30 DIAGNOSIS — E669 Obesity, unspecified: Secondary | ICD-10-CM | POA: Diagnosis not present

## 2017-01-30 DIAGNOSIS — E7800 Pure hypercholesterolemia, unspecified: Secondary | ICD-10-CM | POA: Diagnosis not present

## 2017-01-30 DIAGNOSIS — N183 Chronic kidney disease, stage 3 (moderate): Secondary | ICD-10-CM | POA: Diagnosis not present

## 2017-01-30 DIAGNOSIS — E1122 Type 2 diabetes mellitus with diabetic chronic kidney disease: Secondary | ICD-10-CM | POA: Diagnosis not present

## 2017-02-28 DIAGNOSIS — D631 Anemia in chronic kidney disease: Secondary | ICD-10-CM | POA: Diagnosis not present

## 2017-02-28 DIAGNOSIS — N184 Chronic kidney disease, stage 4 (severe): Secondary | ICD-10-CM | POA: Diagnosis not present

## 2017-02-28 DIAGNOSIS — E1122 Type 2 diabetes mellitus with diabetic chronic kidney disease: Secondary | ICD-10-CM | POA: Diagnosis not present

## 2017-02-28 DIAGNOSIS — I82409 Acute embolism and thrombosis of unspecified deep veins of unspecified lower extremity: Secondary | ICD-10-CM | POA: Diagnosis not present

## 2017-02-28 DIAGNOSIS — N2581 Secondary hyperparathyroidism of renal origin: Secondary | ICD-10-CM | POA: Diagnosis not present

## 2017-02-28 DIAGNOSIS — E785 Hyperlipidemia, unspecified: Secondary | ICD-10-CM | POA: Diagnosis not present

## 2017-02-28 DIAGNOSIS — N400 Enlarged prostate without lower urinary tract symptoms: Secondary | ICD-10-CM | POA: Diagnosis not present

## 2017-02-28 DIAGNOSIS — E669 Obesity, unspecified: Secondary | ICD-10-CM | POA: Diagnosis not present

## 2017-02-28 DIAGNOSIS — E118 Type 2 diabetes mellitus with unspecified complications: Secondary | ICD-10-CM | POA: Diagnosis not present

## 2017-02-28 DIAGNOSIS — I6529 Occlusion and stenosis of unspecified carotid artery: Secondary | ICD-10-CM | POA: Diagnosis not present

## 2017-02-28 DIAGNOSIS — N200 Calculus of kidney: Secondary | ICD-10-CM | POA: Diagnosis not present

## 2017-02-28 DIAGNOSIS — G4730 Sleep apnea, unspecified: Secondary | ICD-10-CM | POA: Diagnosis not present

## 2017-05-30 DIAGNOSIS — Z85820 Personal history of malignant melanoma of skin: Secondary | ICD-10-CM | POA: Diagnosis not present

## 2017-05-30 DIAGNOSIS — L821 Other seborrheic keratosis: Secondary | ICD-10-CM | POA: Diagnosis not present

## 2017-05-30 DIAGNOSIS — D225 Melanocytic nevi of trunk: Secondary | ICD-10-CM | POA: Diagnosis not present

## 2017-05-30 DIAGNOSIS — D1800 Hemangioma unspecified site: Secondary | ICD-10-CM | POA: Diagnosis not present

## 2017-05-30 DIAGNOSIS — Z86018 Personal history of other benign neoplasm: Secondary | ICD-10-CM | POA: Diagnosis not present

## 2017-05-30 DIAGNOSIS — L814 Other melanin hyperpigmentation: Secondary | ICD-10-CM | POA: Diagnosis not present

## 2017-07-31 DIAGNOSIS — D692 Other nonthrombocytopenic purpura: Secondary | ICD-10-CM | POA: Diagnosis not present

## 2017-07-31 DIAGNOSIS — Z23 Encounter for immunization: Secondary | ICD-10-CM | POA: Diagnosis not present

## 2017-07-31 DIAGNOSIS — E669 Obesity, unspecified: Secondary | ICD-10-CM | POA: Diagnosis not present

## 2017-07-31 DIAGNOSIS — M25579 Pain in unspecified ankle and joints of unspecified foot: Secondary | ICD-10-CM | POA: Diagnosis not present

## 2017-07-31 DIAGNOSIS — E7800 Pure hypercholesterolemia, unspecified: Secondary | ICD-10-CM | POA: Diagnosis not present

## 2017-07-31 DIAGNOSIS — E1122 Type 2 diabetes mellitus with diabetic chronic kidney disease: Secondary | ICD-10-CM | POA: Diagnosis not present

## 2017-07-31 DIAGNOSIS — Z0000 Encounter for general adult medical examination without abnormal findings: Secondary | ICD-10-CM | POA: Diagnosis not present

## 2017-07-31 DIAGNOSIS — I6521 Occlusion and stenosis of right carotid artery: Secondary | ICD-10-CM | POA: Diagnosis not present

## 2017-08-03 DIAGNOSIS — I82409 Acute embolism and thrombosis of unspecified deep veins of unspecified lower extremity: Secondary | ICD-10-CM | POA: Diagnosis not present

## 2017-08-03 DIAGNOSIS — N400 Enlarged prostate without lower urinary tract symptoms: Secondary | ICD-10-CM | POA: Diagnosis not present

## 2017-08-03 DIAGNOSIS — I6529 Occlusion and stenosis of unspecified carotid artery: Secondary | ICD-10-CM | POA: Diagnosis not present

## 2017-08-03 DIAGNOSIS — E785 Hyperlipidemia, unspecified: Secondary | ICD-10-CM | POA: Diagnosis not present

## 2017-08-03 DIAGNOSIS — N2581 Secondary hyperparathyroidism of renal origin: Secondary | ICD-10-CM | POA: Diagnosis not present

## 2017-08-03 DIAGNOSIS — D631 Anemia in chronic kidney disease: Secondary | ICD-10-CM | POA: Diagnosis not present

## 2017-08-03 DIAGNOSIS — N184 Chronic kidney disease, stage 4 (severe): Secondary | ICD-10-CM | POA: Diagnosis not present

## 2017-08-03 DIAGNOSIS — N200 Calculus of kidney: Secondary | ICD-10-CM | POA: Diagnosis not present

## 2017-08-03 DIAGNOSIS — E669 Obesity, unspecified: Secondary | ICD-10-CM | POA: Diagnosis not present

## 2017-08-03 DIAGNOSIS — E1122 Type 2 diabetes mellitus with diabetic chronic kidney disease: Secondary | ICD-10-CM | POA: Diagnosis not present

## 2017-08-03 DIAGNOSIS — E118 Type 2 diabetes mellitus with unspecified complications: Secondary | ICD-10-CM | POA: Diagnosis not present

## 2017-08-03 DIAGNOSIS — G4730 Sleep apnea, unspecified: Secondary | ICD-10-CM | POA: Diagnosis not present

## 2017-08-04 ENCOUNTER — Other Ambulatory Visit: Payer: Self-pay | Admitting: Family Medicine

## 2017-08-04 DIAGNOSIS — I6521 Occlusion and stenosis of right carotid artery: Secondary | ICD-10-CM

## 2017-08-28 ENCOUNTER — Ambulatory Visit
Admission: RE | Admit: 2017-08-28 | Discharge: 2017-08-28 | Disposition: A | Discharge status: Home / Self Care | Payer: PPO | Source: Ambulatory Visit | Attending: Family Medicine | Admitting: Family Medicine

## 2017-08-28 DIAGNOSIS — I6521 Occlusion and stenosis of right carotid artery: Secondary | ICD-10-CM

## 2017-08-28 DIAGNOSIS — I639 Cerebral infarction, unspecified: Secondary | ICD-10-CM | POA: Diagnosis not present

## 2017-09-12 DIAGNOSIS — J069 Acute upper respiratory infection, unspecified: Secondary | ICD-10-CM | POA: Diagnosis not present

## 2017-10-10 ENCOUNTER — Encounter (INDEPENDENT_AMBULATORY_CARE_PROVIDER_SITE_OTHER): Payer: PPO | Admitting: Ophthalmology

## 2017-10-10 DIAGNOSIS — H353121 Nonexudative age-related macular degeneration, left eye, early dry stage: Secondary | ICD-10-CM | POA: Diagnosis not present

## 2017-10-10 DIAGNOSIS — H353112 Nonexudative age-related macular degeneration, right eye, intermediate dry stage: Secondary | ICD-10-CM | POA: Diagnosis not present

## 2017-10-10 DIAGNOSIS — H35342 Macular cyst, hole, or pseudohole, left eye: Secondary | ICD-10-CM | POA: Diagnosis not present

## 2017-10-10 DIAGNOSIS — H43811 Vitreous degeneration, right eye: Secondary | ICD-10-CM | POA: Diagnosis not present

## 2017-10-23 ENCOUNTER — Ambulatory Visit (INDEPENDENT_AMBULATORY_CARE_PROVIDER_SITE_OTHER): Payer: PPO | Admitting: Ophthalmology

## 2018-01-04 DIAGNOSIS — E785 Hyperlipidemia, unspecified: Secondary | ICD-10-CM | POA: Diagnosis not present

## 2018-01-04 DIAGNOSIS — N400 Enlarged prostate without lower urinary tract symptoms: Secondary | ICD-10-CM | POA: Diagnosis not present

## 2018-01-04 DIAGNOSIS — E118 Type 2 diabetes mellitus with unspecified complications: Secondary | ICD-10-CM | POA: Diagnosis not present

## 2018-01-04 DIAGNOSIS — G4730 Sleep apnea, unspecified: Secondary | ICD-10-CM | POA: Diagnosis not present

## 2018-01-04 DIAGNOSIS — D631 Anemia in chronic kidney disease: Secondary | ICD-10-CM | POA: Diagnosis not present

## 2018-01-04 DIAGNOSIS — E669 Obesity, unspecified: Secondary | ICD-10-CM | POA: Diagnosis not present

## 2018-01-04 DIAGNOSIS — I6529 Occlusion and stenosis of unspecified carotid artery: Secondary | ICD-10-CM | POA: Diagnosis not present

## 2018-01-04 DIAGNOSIS — N200 Calculus of kidney: Secondary | ICD-10-CM | POA: Diagnosis not present

## 2018-01-04 DIAGNOSIS — N184 Chronic kidney disease, stage 4 (severe): Secondary | ICD-10-CM | POA: Diagnosis not present

## 2018-01-04 DIAGNOSIS — E1122 Type 2 diabetes mellitus with diabetic chronic kidney disease: Secondary | ICD-10-CM | POA: Diagnosis not present

## 2018-01-04 DIAGNOSIS — N529 Male erectile dysfunction, unspecified: Secondary | ICD-10-CM | POA: Diagnosis not present

## 2018-01-04 DIAGNOSIS — N2581 Secondary hyperparathyroidism of renal origin: Secondary | ICD-10-CM | POA: Diagnosis not present

## 2018-02-05 DIAGNOSIS — E669 Obesity, unspecified: Secondary | ICD-10-CM | POA: Diagnosis not present

## 2018-02-05 DIAGNOSIS — E7800 Pure hypercholesterolemia, unspecified: Secondary | ICD-10-CM | POA: Diagnosis not present

## 2018-02-05 DIAGNOSIS — N184 Chronic kidney disease, stage 4 (severe): Secondary | ICD-10-CM | POA: Diagnosis not present

## 2018-02-05 DIAGNOSIS — Z6832 Body mass index (BMI) 32.0-32.9, adult: Secondary | ICD-10-CM | POA: Diagnosis not present

## 2018-02-05 DIAGNOSIS — E1122 Type 2 diabetes mellitus with diabetic chronic kidney disease: Secondary | ICD-10-CM | POA: Diagnosis not present

## 2018-05-03 DIAGNOSIS — E1141 Type 2 diabetes mellitus with diabetic mononeuropathy: Secondary | ICD-10-CM | POA: Diagnosis not present

## 2018-05-31 DIAGNOSIS — N184 Chronic kidney disease, stage 4 (severe): Secondary | ICD-10-CM | POA: Diagnosis not present

## 2018-05-31 DIAGNOSIS — G4730 Sleep apnea, unspecified: Secondary | ICD-10-CM | POA: Diagnosis not present

## 2018-05-31 DIAGNOSIS — I6529 Occlusion and stenosis of unspecified carotid artery: Secondary | ICD-10-CM | POA: Diagnosis not present

## 2018-05-31 DIAGNOSIS — E118 Type 2 diabetes mellitus with unspecified complications: Secondary | ICD-10-CM | POA: Diagnosis not present

## 2018-05-31 DIAGNOSIS — E669 Obesity, unspecified: Secondary | ICD-10-CM | POA: Diagnosis not present

## 2018-05-31 DIAGNOSIS — D631 Anemia in chronic kidney disease: Secondary | ICD-10-CM | POA: Diagnosis not present

## 2018-05-31 DIAGNOSIS — E785 Hyperlipidemia, unspecified: Secondary | ICD-10-CM | POA: Diagnosis not present

## 2018-05-31 DIAGNOSIS — N400 Enlarged prostate without lower urinary tract symptoms: Secondary | ICD-10-CM | POA: Diagnosis not present

## 2018-05-31 DIAGNOSIS — N529 Male erectile dysfunction, unspecified: Secondary | ICD-10-CM | POA: Diagnosis not present

## 2018-05-31 DIAGNOSIS — N200 Calculus of kidney: Secondary | ICD-10-CM | POA: Diagnosis not present

## 2018-05-31 DIAGNOSIS — N2581 Secondary hyperparathyroidism of renal origin: Secondary | ICD-10-CM | POA: Diagnosis not present

## 2018-05-31 DIAGNOSIS — E1122 Type 2 diabetes mellitus with diabetic chronic kidney disease: Secondary | ICD-10-CM | POA: Diagnosis not present

## 2018-06-12 DIAGNOSIS — E1122 Type 2 diabetes mellitus with diabetic chronic kidney disease: Secondary | ICD-10-CM | POA: Diagnosis not present

## 2018-07-11 IMAGING — US US CAROTID DUPLEX BILAT
1 series · 13 of 24 positions shown · non-contrast
Comparison: 07/20/2016

CLINICAL DATA: Right carotid stenosis

EXAM:
BILATERAL CAROTID DUPLEX ULTRASOUND
TECHNIQUE: Gray scale imaging, color Doppler and duplex ultrasound were
performed of bilateral carotid and vertebral arteries in the neck.

[Series 1: us carotid duplex bilat · 0.06mm/px · 13 of 69 slices shown]
[im 1/69]
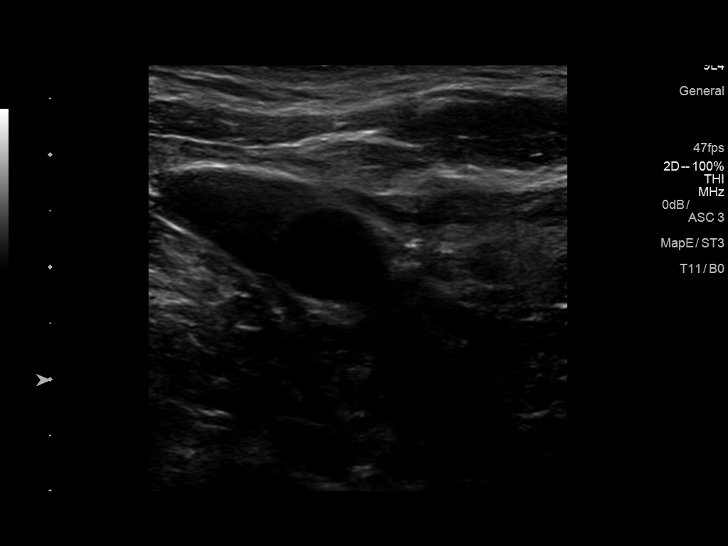
[im 6/69]
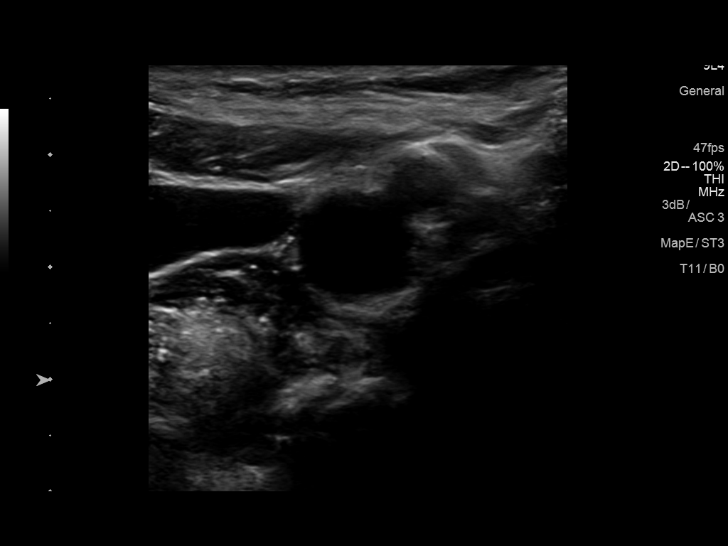
[im 12/69]
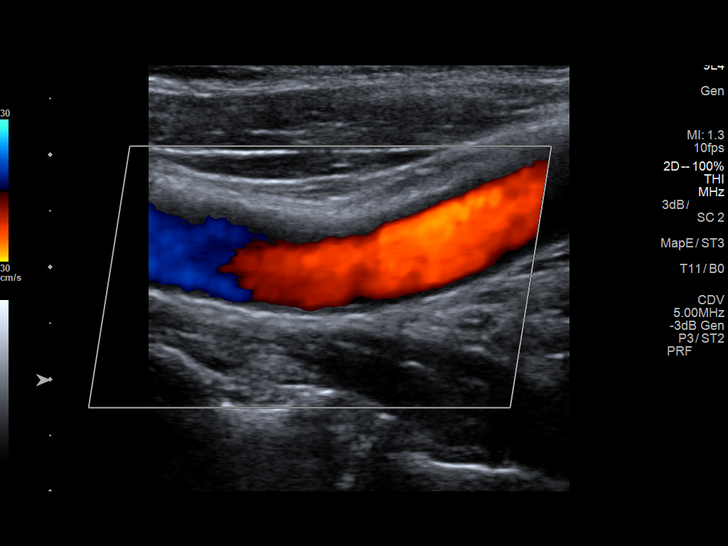
[im 18/69]
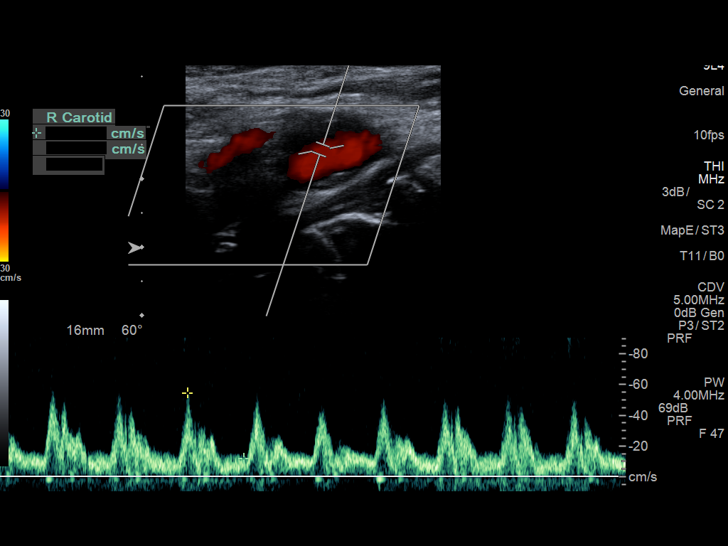
[im 24/69]
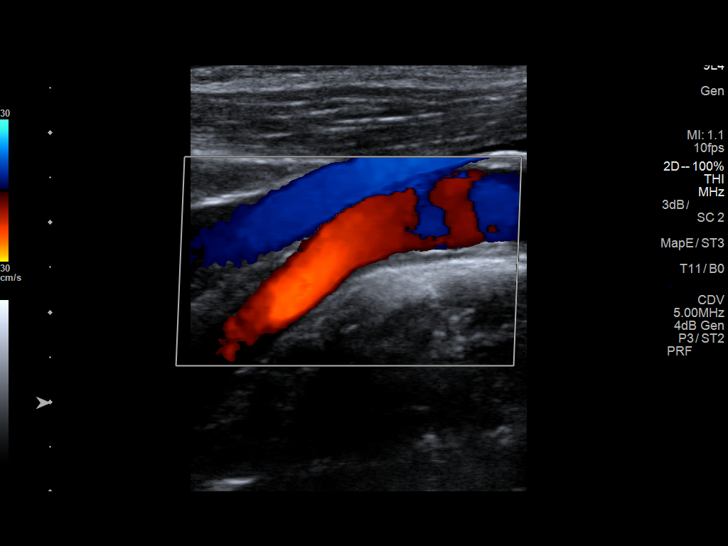
[im 30/69]
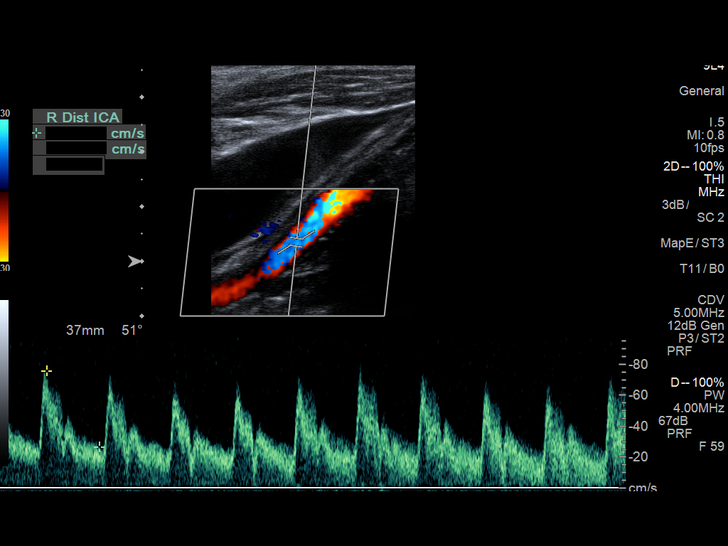
[im 36/69]
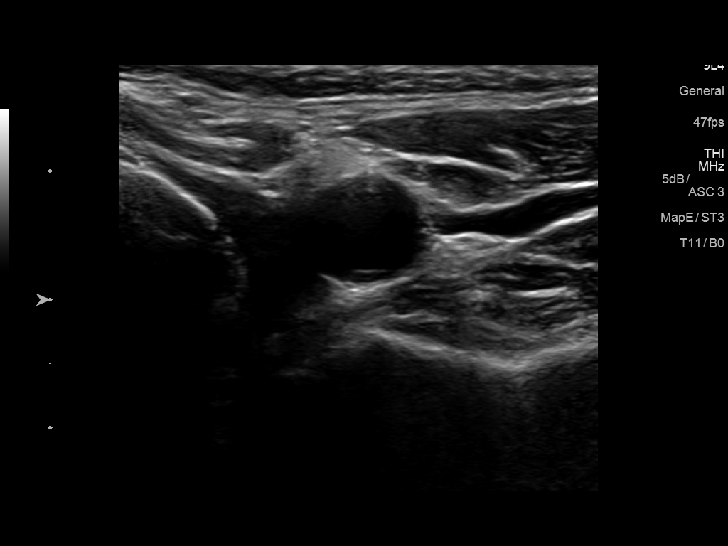
[im 39/69]
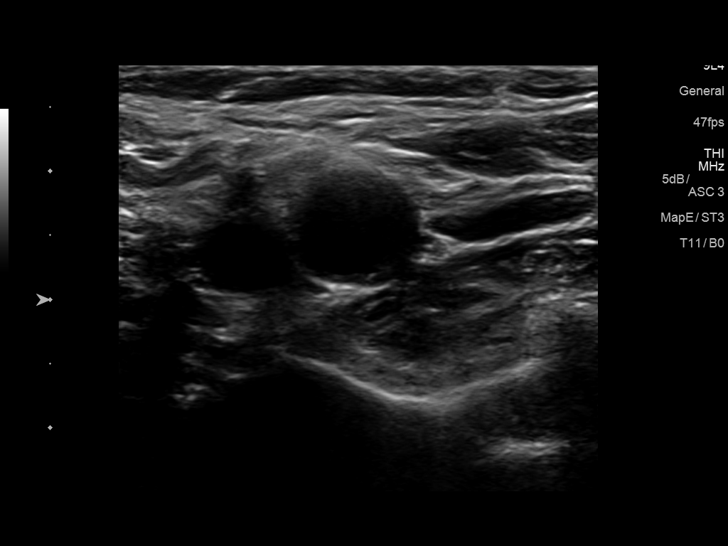
[im 45/69]
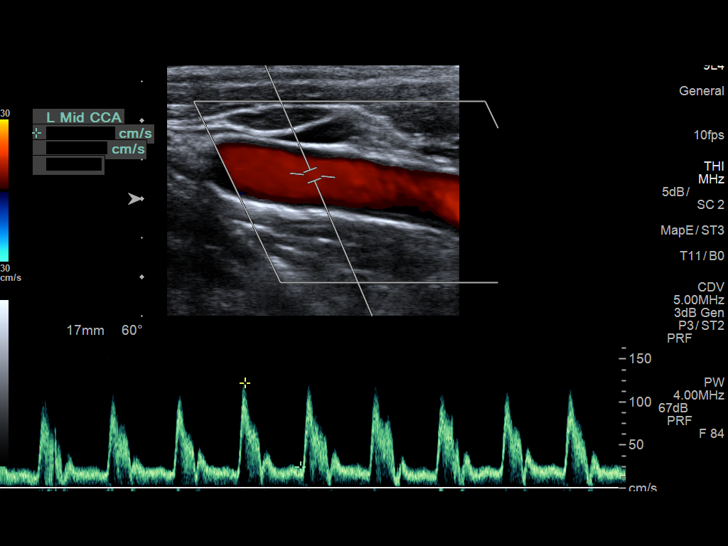
[im 51/69]
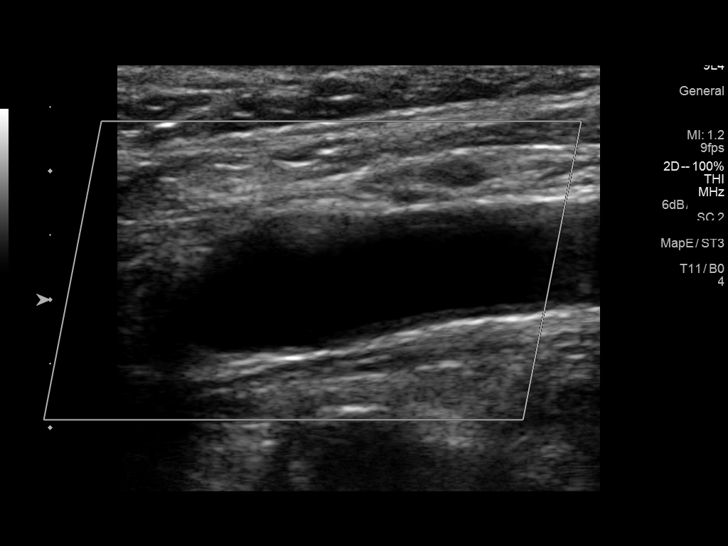
[im 57/69]
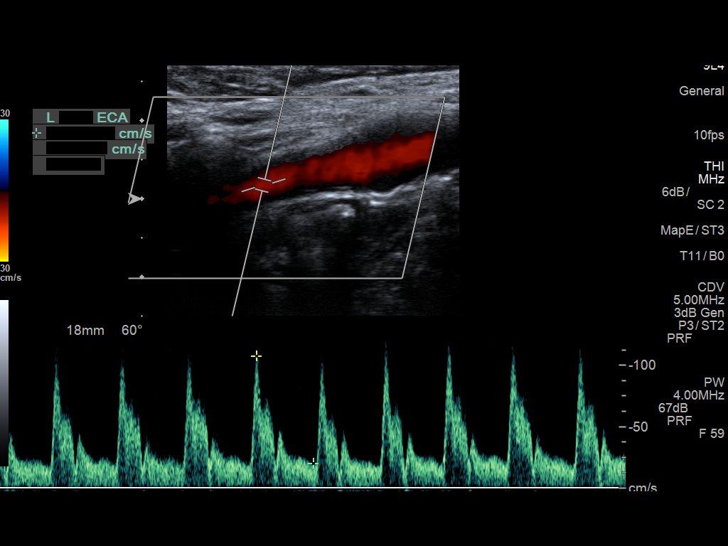
[im 63/69]
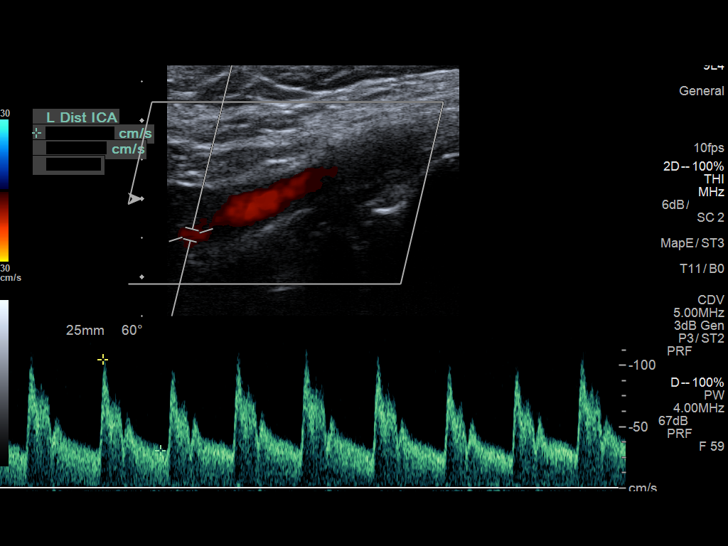
[im 69/69]
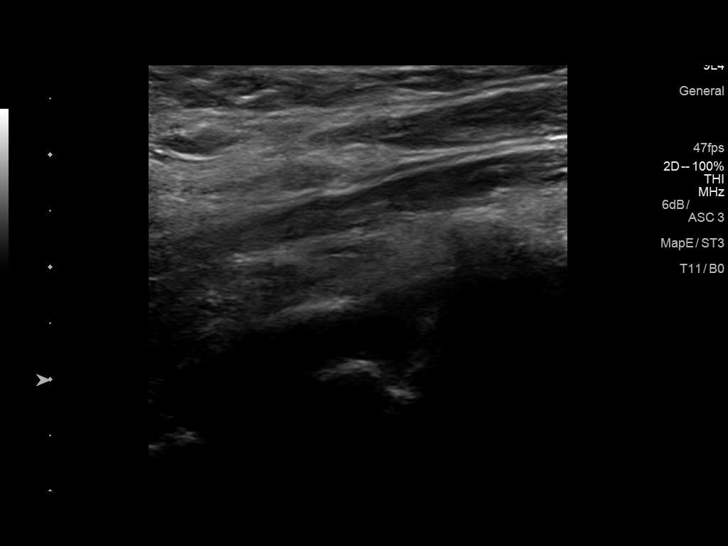

[13 of 24 positions shown; findings below may reference images not displayed]

FINDINGS: Criteria: Quantification of carotid stenosis is based on velocity
parameters that correlate the residual internal carotid diameter
with NASCET-based stenosis levels, using the diameter of the distal
internal carotid lumen as the denominator for stenosis measurement.

The following velocity measurements were obtained:

RIGHT

ICA:  83 cm/sec

CCA:  124 cm/sec

SYSTOLIC ICA/CCA RATIO:

DIASTOLIC ICA/CCA RATIO:

ECA:  156 cm/sec

LEFT

ICA:  105 cm/sec

CCA:  122 cm/sec

SYSTOLIC ICA/CCA RATIO:

DIASTOLIC ICA/CCA RATIO:

ECA:  108 cm/sec

RIGHT CAROTID ARTERY: Minimal intimal thickening in the bulb. Low
resistance internal carotid Doppler pattern is preserved.

RIGHT VERTEBRAL ARTERY:  Antegrade.

LEFT CAROTID ARTERY: Minimal plaque in the bulb. Low resistance
internal carotid Doppler pattern is preserved.

LEFT VERTEBRAL ARTERY:  Antegrade.
IMPRESSION: Less than 50% stenosis in the right and left internal carotid
artery.

## 2018-07-12 ENCOUNTER — Encounter (INDEPENDENT_AMBULATORY_CARE_PROVIDER_SITE_OTHER): Payer: PPO | Admitting: Ophthalmology

## 2018-07-12 DIAGNOSIS — H35342 Macular cyst, hole, or pseudohole, left eye: Secondary | ICD-10-CM

## 2018-07-12 DIAGNOSIS — H353112 Nonexudative age-related macular degeneration, right eye, intermediate dry stage: Secondary | ICD-10-CM

## 2018-07-12 DIAGNOSIS — H43811 Vitreous degeneration, right eye: Secondary | ICD-10-CM | POA: Diagnosis not present

## 2018-07-26 DIAGNOSIS — J209 Acute bronchitis, unspecified: Secondary | ICD-10-CM | POA: Diagnosis not present

## 2018-08-06 DIAGNOSIS — E1151 Type 2 diabetes mellitus with diabetic peripheral angiopathy without gangrene: Secondary | ICD-10-CM | POA: Diagnosis not present

## 2018-08-06 DIAGNOSIS — I739 Peripheral vascular disease, unspecified: Secondary | ICD-10-CM | POA: Diagnosis not present

## 2018-08-06 DIAGNOSIS — L603 Nail dystrophy: Secondary | ICD-10-CM | POA: Diagnosis not present

## 2018-08-16 DIAGNOSIS — J309 Allergic rhinitis, unspecified: Secondary | ICD-10-CM | POA: Diagnosis not present

## 2018-08-16 DIAGNOSIS — E7800 Pure hypercholesterolemia, unspecified: Secondary | ICD-10-CM | POA: Diagnosis not present

## 2018-08-16 DIAGNOSIS — H9190 Unspecified hearing loss, unspecified ear: Secondary | ICD-10-CM | POA: Diagnosis not present

## 2018-08-16 DIAGNOSIS — N183 Chronic kidney disease, stage 3 (moderate): Secondary | ICD-10-CM | POA: Diagnosis not present

## 2018-08-16 DIAGNOSIS — Z0000 Encounter for general adult medical examination without abnormal findings: Secondary | ICD-10-CM | POA: Diagnosis not present

## 2018-08-16 DIAGNOSIS — I6521 Occlusion and stenosis of right carotid artery: Secondary | ICD-10-CM | POA: Diagnosis not present

## 2018-08-16 DIAGNOSIS — E1122 Type 2 diabetes mellitus with diabetic chronic kidney disease: Secondary | ICD-10-CM | POA: Diagnosis not present

## 2018-08-16 DIAGNOSIS — E669 Obesity, unspecified: Secondary | ICD-10-CM | POA: Diagnosis not present

## 2018-08-16 DIAGNOSIS — Z6833 Body mass index (BMI) 33.0-33.9, adult: Secondary | ICD-10-CM | POA: Diagnosis not present

## 2018-09-04 DIAGNOSIS — H903 Sensorineural hearing loss, bilateral: Secondary | ICD-10-CM | POA: Diagnosis not present

## 2018-09-04 DIAGNOSIS — H9113 Presbycusis, bilateral: Secondary | ICD-10-CM | POA: Diagnosis not present

## 2018-09-14 DIAGNOSIS — N529 Male erectile dysfunction, unspecified: Secondary | ICD-10-CM | POA: Diagnosis not present

## 2018-09-14 DIAGNOSIS — N189 Chronic kidney disease, unspecified: Secondary | ICD-10-CM | POA: Diagnosis not present

## 2018-09-14 DIAGNOSIS — N2581 Secondary hyperparathyroidism of renal origin: Secondary | ICD-10-CM | POA: Diagnosis not present

## 2018-09-14 DIAGNOSIS — I6529 Occlusion and stenosis of unspecified carotid artery: Secondary | ICD-10-CM | POA: Diagnosis not present

## 2018-09-14 DIAGNOSIS — E669 Obesity, unspecified: Secondary | ICD-10-CM | POA: Diagnosis not present

## 2018-09-14 DIAGNOSIS — E1122 Type 2 diabetes mellitus with diabetic chronic kidney disease: Secondary | ICD-10-CM | POA: Diagnosis not present

## 2018-09-14 DIAGNOSIS — D631 Anemia in chronic kidney disease: Secondary | ICD-10-CM | POA: Diagnosis not present

## 2018-09-14 DIAGNOSIS — N200 Calculus of kidney: Secondary | ICD-10-CM | POA: Diagnosis not present

## 2018-09-14 DIAGNOSIS — G4730 Sleep apnea, unspecified: Secondary | ICD-10-CM | POA: Diagnosis not present

## 2018-09-14 DIAGNOSIS — N184 Chronic kidney disease, stage 4 (severe): Secondary | ICD-10-CM | POA: Diagnosis not present

## 2018-09-14 DIAGNOSIS — N400 Enlarged prostate without lower urinary tract symptoms: Secondary | ICD-10-CM | POA: Diagnosis not present

## 2018-09-14 DIAGNOSIS — E118 Type 2 diabetes mellitus with unspecified complications: Secondary | ICD-10-CM | POA: Diagnosis not present

## 2018-09-14 DIAGNOSIS — E785 Hyperlipidemia, unspecified: Secondary | ICD-10-CM | POA: Diagnosis not present

## 2018-09-25 DIAGNOSIS — R351 Nocturia: Secondary | ICD-10-CM | POA: Diagnosis not present

## 2018-09-25 DIAGNOSIS — R3912 Poor urinary stream: Secondary | ICD-10-CM | POA: Diagnosis not present

## 2018-09-25 DIAGNOSIS — N401 Enlarged prostate with lower urinary tract symptoms: Secondary | ICD-10-CM | POA: Diagnosis not present

## 2018-09-25 DIAGNOSIS — Z87442 Personal history of urinary calculi: Secondary | ICD-10-CM | POA: Diagnosis not present

## 2018-10-12 DIAGNOSIS — I129 Hypertensive chronic kidney disease with stage 1 through stage 4 chronic kidney disease, or unspecified chronic kidney disease: Secondary | ICD-10-CM | POA: Diagnosis not present

## 2018-10-12 DIAGNOSIS — N184 Chronic kidney disease, stage 4 (severe): Secondary | ICD-10-CM | POA: Diagnosis not present

## 2018-10-22 DIAGNOSIS — J069 Acute upper respiratory infection, unspecified: Secondary | ICD-10-CM | POA: Diagnosis not present

## 2018-11-09 DIAGNOSIS — E1141 Type 2 diabetes mellitus with diabetic mononeuropathy: Secondary | ICD-10-CM | POA: Diagnosis not present

## 2018-11-28 DIAGNOSIS — R42 Dizziness and giddiness: Secondary | ICD-10-CM | POA: Diagnosis not present

## 2018-12-03 ENCOUNTER — Other Ambulatory Visit: Payer: Self-pay | Admitting: Family Medicine

## 2018-12-03 DIAGNOSIS — R42 Dizziness and giddiness: Secondary | ICD-10-CM

## 2018-12-11 ENCOUNTER — Ambulatory Visit
Admission: RE | Admit: 2018-12-11 | Discharge: 2018-12-11 | Disposition: A | Discharge status: Home / Self Care | Payer: PPO | Source: Ambulatory Visit | Attending: Family Medicine | Admitting: Family Medicine

## 2018-12-11 DIAGNOSIS — H9190 Unspecified hearing loss, unspecified ear: Secondary | ICD-10-CM | POA: Diagnosis not present

## 2018-12-11 DIAGNOSIS — R42 Dizziness and giddiness: Secondary | ICD-10-CM

## 2018-12-14 DIAGNOSIS — I82409 Acute embolism and thrombosis of unspecified deep veins of unspecified lower extremity: Secondary | ICD-10-CM | POA: Diagnosis not present

## 2018-12-14 DIAGNOSIS — E785 Hyperlipidemia, unspecified: Secondary | ICD-10-CM | POA: Diagnosis not present

## 2018-12-14 DIAGNOSIS — N2581 Secondary hyperparathyroidism of renal origin: Secondary | ICD-10-CM | POA: Diagnosis not present

## 2018-12-14 DIAGNOSIS — E1122 Type 2 diabetes mellitus with diabetic chronic kidney disease: Secondary | ICD-10-CM | POA: Diagnosis not present

## 2018-12-14 DIAGNOSIS — N200 Calculus of kidney: Secondary | ICD-10-CM | POA: Diagnosis not present

## 2018-12-14 DIAGNOSIS — N400 Enlarged prostate without lower urinary tract symptoms: Secondary | ICD-10-CM | POA: Diagnosis not present

## 2018-12-14 DIAGNOSIS — E669 Obesity, unspecified: Secondary | ICD-10-CM | POA: Diagnosis not present

## 2018-12-14 DIAGNOSIS — I129 Hypertensive chronic kidney disease with stage 1 through stage 4 chronic kidney disease, or unspecified chronic kidney disease: Secondary | ICD-10-CM | POA: Diagnosis not present

## 2018-12-14 DIAGNOSIS — N184 Chronic kidney disease, stage 4 (severe): Secondary | ICD-10-CM | POA: Diagnosis not present

## 2018-12-14 DIAGNOSIS — I6529 Occlusion and stenosis of unspecified carotid artery: Secondary | ICD-10-CM | POA: Diagnosis not present

## 2018-12-14 DIAGNOSIS — D631 Anemia in chronic kidney disease: Secondary | ICD-10-CM | POA: Diagnosis not present

## 2018-12-14 DIAGNOSIS — E118 Type 2 diabetes mellitus with unspecified complications: Secondary | ICD-10-CM | POA: Diagnosis not present

## 2018-12-17 DIAGNOSIS — N184 Chronic kidney disease, stage 4 (severe): Secondary | ICD-10-CM | POA: Diagnosis not present

## 2018-12-17 DIAGNOSIS — J019 Acute sinusitis, unspecified: Secondary | ICD-10-CM | POA: Diagnosis not present

## 2019-02-13 DIAGNOSIS — E1122 Type 2 diabetes mellitus with diabetic chronic kidney disease: Secondary | ICD-10-CM | POA: Diagnosis not present

## 2019-02-13 DIAGNOSIS — N183 Chronic kidney disease, stage 3 (moderate): Secondary | ICD-10-CM | POA: Diagnosis not present

## 2019-02-15 DIAGNOSIS — N184 Chronic kidney disease, stage 4 (severe): Secondary | ICD-10-CM | POA: Diagnosis not present

## 2019-02-15 DIAGNOSIS — E7800 Pure hypercholesterolemia, unspecified: Secondary | ICD-10-CM | POA: Diagnosis not present

## 2019-02-15 DIAGNOSIS — E1122 Type 2 diabetes mellitus with diabetic chronic kidney disease: Secondary | ICD-10-CM | POA: Diagnosis not present

## 2019-02-15 DIAGNOSIS — R109 Unspecified abdominal pain: Secondary | ICD-10-CM | POA: Diagnosis not present

## 2019-03-01 DIAGNOSIS — E669 Obesity, unspecified: Secondary | ICD-10-CM | POA: Diagnosis not present

## 2019-03-01 DIAGNOSIS — E785 Hyperlipidemia, unspecified: Secondary | ICD-10-CM | POA: Diagnosis not present

## 2019-03-01 DIAGNOSIS — D631 Anemia in chronic kidney disease: Secondary | ICD-10-CM | POA: Diagnosis not present

## 2019-03-01 DIAGNOSIS — I129 Hypertensive chronic kidney disease with stage 1 through stage 4 chronic kidney disease, or unspecified chronic kidney disease: Secondary | ICD-10-CM | POA: Diagnosis not present

## 2019-03-01 DIAGNOSIS — E118 Type 2 diabetes mellitus with unspecified complications: Secondary | ICD-10-CM | POA: Diagnosis not present

## 2019-03-01 DIAGNOSIS — E1122 Type 2 diabetes mellitus with diabetic chronic kidney disease: Secondary | ICD-10-CM | POA: Diagnosis not present

## 2019-03-01 DIAGNOSIS — N184 Chronic kidney disease, stage 4 (severe): Secondary | ICD-10-CM | POA: Diagnosis not present

## 2019-03-01 DIAGNOSIS — I82409 Acute embolism and thrombosis of unspecified deep veins of unspecified lower extremity: Secondary | ICD-10-CM | POA: Diagnosis not present

## 2019-03-01 DIAGNOSIS — N200 Calculus of kidney: Secondary | ICD-10-CM | POA: Diagnosis not present

## 2019-03-01 DIAGNOSIS — N400 Enlarged prostate without lower urinary tract symptoms: Secondary | ICD-10-CM | POA: Diagnosis not present

## 2019-03-01 DIAGNOSIS — N2581 Secondary hyperparathyroidism of renal origin: Secondary | ICD-10-CM | POA: Diagnosis not present

## 2019-03-01 DIAGNOSIS — I6529 Occlusion and stenosis of unspecified carotid artery: Secondary | ICD-10-CM | POA: Diagnosis not present

## 2019-04-15 ENCOUNTER — Other Ambulatory Visit: Payer: Self-pay

## 2019-04-15 ENCOUNTER — Encounter (INDEPENDENT_AMBULATORY_CARE_PROVIDER_SITE_OTHER): Payer: PPO | Admitting: Ophthalmology

## 2019-04-15 DIAGNOSIS — H353121 Nonexudative age-related macular degeneration, left eye, early dry stage: Secondary | ICD-10-CM | POA: Diagnosis not present

## 2019-04-15 DIAGNOSIS — H353112 Nonexudative age-related macular degeneration, right eye, intermediate dry stage: Secondary | ICD-10-CM | POA: Diagnosis not present

## 2019-04-15 DIAGNOSIS — H43813 Vitreous degeneration, bilateral: Secondary | ICD-10-CM | POA: Diagnosis not present

## 2019-04-15 DIAGNOSIS — H35342 Macular cyst, hole, or pseudohole, left eye: Secondary | ICD-10-CM

## 2019-06-12 DIAGNOSIS — N400 Enlarged prostate without lower urinary tract symptoms: Secondary | ICD-10-CM | POA: Diagnosis not present

## 2019-06-12 DIAGNOSIS — I129 Hypertensive chronic kidney disease with stage 1 through stage 4 chronic kidney disease, or unspecified chronic kidney disease: Secondary | ICD-10-CM | POA: Diagnosis not present

## 2019-06-12 DIAGNOSIS — E1122 Type 2 diabetes mellitus with diabetic chronic kidney disease: Secondary | ICD-10-CM | POA: Diagnosis not present

## 2019-06-12 DIAGNOSIS — G4730 Sleep apnea, unspecified: Secondary | ICD-10-CM | POA: Diagnosis not present

## 2019-06-12 DIAGNOSIS — N184 Chronic kidney disease, stage 4 (severe): Secondary | ICD-10-CM | POA: Diagnosis not present

## 2019-06-12 DIAGNOSIS — N2581 Secondary hyperparathyroidism of renal origin: Secondary | ICD-10-CM | POA: Diagnosis not present

## 2019-06-12 DIAGNOSIS — N200 Calculus of kidney: Secondary | ICD-10-CM | POA: Diagnosis not present

## 2019-06-12 DIAGNOSIS — D631 Anemia in chronic kidney disease: Secondary | ICD-10-CM | POA: Diagnosis not present

## 2019-06-12 DIAGNOSIS — E118 Type 2 diabetes mellitus with unspecified complications: Secondary | ICD-10-CM | POA: Diagnosis not present

## 2019-06-12 DIAGNOSIS — E785 Hyperlipidemia, unspecified: Secondary | ICD-10-CM | POA: Diagnosis not present

## 2019-06-12 DIAGNOSIS — I6529 Occlusion and stenosis of unspecified carotid artery: Secondary | ICD-10-CM | POA: Diagnosis not present

## 2019-06-12 DIAGNOSIS — E669 Obesity, unspecified: Secondary | ICD-10-CM | POA: Diagnosis not present

## 2019-06-12 DIAGNOSIS — N189 Chronic kidney disease, unspecified: Secondary | ICD-10-CM | POA: Diagnosis not present

## 2019-09-04 DIAGNOSIS — Z0000 Encounter for general adult medical examination without abnormal findings: Secondary | ICD-10-CM | POA: Diagnosis not present

## 2019-09-04 DIAGNOSIS — R42 Dizziness and giddiness: Secondary | ICD-10-CM | POA: Diagnosis not present

## 2019-09-04 DIAGNOSIS — R5383 Other fatigue: Secondary | ICD-10-CM | POA: Diagnosis not present

## 2019-09-04 DIAGNOSIS — E1122 Type 2 diabetes mellitus with diabetic chronic kidney disease: Secondary | ICD-10-CM | POA: Diagnosis not present

## 2019-09-04 DIAGNOSIS — E7800 Pure hypercholesterolemia, unspecified: Secondary | ICD-10-CM | POA: Diagnosis not present

## 2019-09-04 DIAGNOSIS — N184 Chronic kidney disease, stage 4 (severe): Secondary | ICD-10-CM | POA: Diagnosis not present

## 2019-09-17 DIAGNOSIS — I129 Hypertensive chronic kidney disease with stage 1 through stage 4 chronic kidney disease, or unspecified chronic kidney disease: Secondary | ICD-10-CM | POA: Diagnosis not present

## 2019-09-17 DIAGNOSIS — E118 Type 2 diabetes mellitus with unspecified complications: Secondary | ICD-10-CM | POA: Diagnosis not present

## 2019-09-17 DIAGNOSIS — E1122 Type 2 diabetes mellitus with diabetic chronic kidney disease: Secondary | ICD-10-CM | POA: Diagnosis not present

## 2019-09-17 DIAGNOSIS — D631 Anemia in chronic kidney disease: Secondary | ICD-10-CM | POA: Diagnosis not present

## 2019-09-17 DIAGNOSIS — E785 Hyperlipidemia, unspecified: Secondary | ICD-10-CM | POA: Diagnosis not present

## 2019-09-17 DIAGNOSIS — N2581 Secondary hyperparathyroidism of renal origin: Secondary | ICD-10-CM | POA: Diagnosis not present

## 2019-09-17 DIAGNOSIS — N184 Chronic kidney disease, stage 4 (severe): Secondary | ICD-10-CM | POA: Diagnosis not present

## 2019-09-17 DIAGNOSIS — N400 Enlarged prostate without lower urinary tract symptoms: Secondary | ICD-10-CM | POA: Diagnosis not present

## 2019-09-17 DIAGNOSIS — G4730 Sleep apnea, unspecified: Secondary | ICD-10-CM | POA: Diagnosis not present

## 2019-09-17 DIAGNOSIS — E669 Obesity, unspecified: Secondary | ICD-10-CM | POA: Diagnosis not present

## 2019-09-17 DIAGNOSIS — I6529 Occlusion and stenosis of unspecified carotid artery: Secondary | ICD-10-CM | POA: Diagnosis not present

## 2019-09-17 DIAGNOSIS — N200 Calculus of kidney: Secondary | ICD-10-CM | POA: Diagnosis not present

## 2019-09-25 DIAGNOSIS — R5383 Other fatigue: Secondary | ICD-10-CM | POA: Diagnosis not present

## 2019-09-25 DIAGNOSIS — E1122 Type 2 diabetes mellitus with diabetic chronic kidney disease: Secondary | ICD-10-CM | POA: Diagnosis not present

## 2019-09-25 DIAGNOSIS — N184 Chronic kidney disease, stage 4 (severe): Secondary | ICD-10-CM | POA: Diagnosis not present

## 2019-09-25 DIAGNOSIS — Z0000 Encounter for general adult medical examination without abnormal findings: Secondary | ICD-10-CM | POA: Diagnosis not present

## 2019-09-25 DIAGNOSIS — E7800 Pure hypercholesterolemia, unspecified: Secondary | ICD-10-CM | POA: Diagnosis not present

## 2019-10-04 DIAGNOSIS — N400 Enlarged prostate without lower urinary tract symptoms: Secondary | ICD-10-CM | POA: Diagnosis not present

## 2019-11-27 DIAGNOSIS — D631 Anemia in chronic kidney disease: Secondary | ICD-10-CM | POA: Diagnosis not present

## 2019-11-27 DIAGNOSIS — G4730 Sleep apnea, unspecified: Secondary | ICD-10-CM | POA: Diagnosis not present

## 2019-11-27 DIAGNOSIS — I129 Hypertensive chronic kidney disease with stage 1 through stage 4 chronic kidney disease, or unspecified chronic kidney disease: Secondary | ICD-10-CM | POA: Diagnosis not present

## 2019-11-27 DIAGNOSIS — I82409 Acute embolism and thrombosis of unspecified deep veins of unspecified lower extremity: Secondary | ICD-10-CM | POA: Diagnosis not present

## 2019-11-27 DIAGNOSIS — E785 Hyperlipidemia, unspecified: Secondary | ICD-10-CM | POA: Diagnosis not present

## 2019-11-27 DIAGNOSIS — I6529 Occlusion and stenosis of unspecified carotid artery: Secondary | ICD-10-CM | POA: Diagnosis not present

## 2019-11-27 DIAGNOSIS — N184 Chronic kidney disease, stage 4 (severe): Secondary | ICD-10-CM | POA: Diagnosis not present

## 2019-11-27 DIAGNOSIS — N189 Chronic kidney disease, unspecified: Secondary | ICD-10-CM | POA: Diagnosis not present

## 2019-11-27 DIAGNOSIS — E1122 Type 2 diabetes mellitus with diabetic chronic kidney disease: Secondary | ICD-10-CM | POA: Diagnosis not present

## 2019-11-27 DIAGNOSIS — N2881 Hypertrophy of kidney: Secondary | ICD-10-CM | POA: Diagnosis not present

## 2019-11-27 DIAGNOSIS — N400 Enlarged prostate without lower urinary tract symptoms: Secondary | ICD-10-CM | POA: Diagnosis not present

## 2019-11-27 DIAGNOSIS — N200 Calculus of kidney: Secondary | ICD-10-CM | POA: Diagnosis not present

## 2019-11-27 DIAGNOSIS — N2581 Secondary hyperparathyroidism of renal origin: Secondary | ICD-10-CM | POA: Diagnosis not present

## 2019-11-27 DIAGNOSIS — E118 Type 2 diabetes mellitus with unspecified complications: Secondary | ICD-10-CM | POA: Diagnosis not present

## 2019-12-08 ENCOUNTER — Ambulatory Visit: Payer: PPO | Attending: Internal Medicine

## 2019-12-08 DIAGNOSIS — Z23 Encounter for immunization: Secondary | ICD-10-CM

## 2019-12-08 NOTE — Progress Notes (Signed)
   Covid-19 Vaccination Clinic  Name:  Ian Rhodes    MRN: [id number] DOB: 1936/02/25  12/08/2019  Ian Rhodes was observed post Covid-19 immunization for 15 minutes without incidence. He was provided with Vaccine Information Sheet and instruction to access the V-Safe system.   Ian Rhodes was instructed to call 911 with any severe reactions post vaccine: Marland Kitchen Difficulty breathing  . Swelling of your face and throat  . A fast heartbeat  . A bad rash all over your body  . Dizziness and weakness    Immunizations Administered    Name Date Dose VIS Date Route   Pfizer COVID-19 Vaccine 12/08/2019  8:53 AM 0.3 mL 10/04/2019 Intramuscular   Manufacturer: [place]   Lot: [id number]   [place]: [id number]

## 2019-12-30 DIAGNOSIS — N184 Chronic kidney disease, stage 4 (severe): Secondary | ICD-10-CM | POA: Diagnosis not present

## 2019-12-31 ENCOUNTER — Ambulatory Visit: Payer: PPO | Attending: Internal Medicine

## 2019-12-31 DIAGNOSIS — Z23 Encounter for immunization: Secondary | ICD-10-CM

## 2019-12-31 NOTE — Progress Notes (Signed)
   Covid-19 Vaccination Clinic  Name:  Ian Rhodes    MRN: [id number] DOB: 03/25/1936  12/31/2019  Ian Rhodes was observed post Covid-19 immunization for 15 minutes without incident. He was provided with Vaccine Information Sheet and instruction to access the V-Safe system.   Ian Rhodes was instructed to call 911 with any severe reactions post vaccine: Marland Kitchen Difficulty breathing  . Swelling of face and throat  . A fast heartbeat  . A bad rash all over body  . Dizziness and weakness   Immunizations Administered    Name Date Dose VIS Date Route   Pfizer COVID-19 Vaccine 12/31/2019  8:59 AM 0.3 mL 10/04/2019 Intramuscular   Manufacturer: [place]   Lot: [id number]   [place]: [id number]

## 2020-02-21 DIAGNOSIS — I739 Peripheral vascular disease, unspecified: Secondary | ICD-10-CM | POA: Diagnosis not present

## 2020-02-21 DIAGNOSIS — L603 Nail dystrophy: Secondary | ICD-10-CM | POA: Diagnosis not present

## 2020-02-21 DIAGNOSIS — E1151 Type 2 diabetes mellitus with diabetic peripheral angiopathy without gangrene: Secondary | ICD-10-CM | POA: Diagnosis not present

## 2020-03-09 DIAGNOSIS — R5383 Other fatigue: Secondary | ICD-10-CM | POA: Diagnosis not present

## 2020-03-09 DIAGNOSIS — E7800 Pure hypercholesterolemia, unspecified: Secondary | ICD-10-CM | POA: Diagnosis not present

## 2020-03-09 DIAGNOSIS — E559 Vitamin D deficiency, unspecified: Secondary | ICD-10-CM | POA: Diagnosis not present

## 2020-03-09 DIAGNOSIS — E1122 Type 2 diabetes mellitus with diabetic chronic kidney disease: Secondary | ICD-10-CM | POA: Diagnosis not present

## 2020-03-09 DIAGNOSIS — N184 Chronic kidney disease, stage 4 (severe): Secondary | ICD-10-CM | POA: Diagnosis not present

## 2020-03-11 DIAGNOSIS — I6529 Occlusion and stenosis of unspecified carotid artery: Secondary | ICD-10-CM | POA: Diagnosis not present

## 2020-03-11 DIAGNOSIS — D631 Anemia in chronic kidney disease: Secondary | ICD-10-CM | POA: Diagnosis not present

## 2020-03-11 DIAGNOSIS — E118 Type 2 diabetes mellitus with unspecified complications: Secondary | ICD-10-CM | POA: Diagnosis not present

## 2020-03-11 DIAGNOSIS — N184 Chronic kidney disease, stage 4 (severe): Secondary | ICD-10-CM | POA: Diagnosis not present

## 2020-03-11 DIAGNOSIS — E1122 Type 2 diabetes mellitus with diabetic chronic kidney disease: Secondary | ICD-10-CM | POA: Diagnosis not present

## 2020-03-11 DIAGNOSIS — I129 Hypertensive chronic kidney disease with stage 1 through stage 4 chronic kidney disease, or unspecified chronic kidney disease: Secondary | ICD-10-CM | POA: Diagnosis not present

## 2020-03-11 DIAGNOSIS — Z0000 Encounter for general adult medical examination without abnormal findings: Secondary | ICD-10-CM | POA: Diagnosis not present

## 2020-03-11 DIAGNOSIS — N400 Enlarged prostate without lower urinary tract symptoms: Secondary | ICD-10-CM | POA: Diagnosis not present

## 2020-03-11 DIAGNOSIS — I82409 Acute embolism and thrombosis of unspecified deep veins of unspecified lower extremity: Secondary | ICD-10-CM | POA: Diagnosis not present

## 2020-03-11 DIAGNOSIS — N200 Calculus of kidney: Secondary | ICD-10-CM | POA: Diagnosis not present

## 2020-03-11 DIAGNOSIS — E785 Hyperlipidemia, unspecified: Secondary | ICD-10-CM | POA: Diagnosis not present

## 2020-03-11 DIAGNOSIS — N2581 Secondary hyperparathyroidism of renal origin: Secondary | ICD-10-CM | POA: Diagnosis not present

## 2020-03-26 DIAGNOSIS — Z03818 Encounter for observation for suspected exposure to other biological agents ruled out: Secondary | ICD-10-CM | POA: Diagnosis not present

## 2020-03-26 DIAGNOSIS — R509 Fever, unspecified: Secondary | ICD-10-CM | POA: Diagnosis not present

## 2020-03-26 DIAGNOSIS — R05 Cough: Secondary | ICD-10-CM | POA: Diagnosis not present

## 2020-03-30 DIAGNOSIS — U071 COVID-19: Secondary | ICD-10-CM | POA: Diagnosis not present

## 2020-04-10 ENCOUNTER — Inpatient Hospital Stay (HOSPITAL_COMMUNITY)
Admission: EM | Admit: 2020-04-10 | Discharge: 2020-04-12 | DRG: 641 | Disposition: A | Discharge status: Home / Self Care | Payer: PPO | Attending: Internal Medicine | Admitting: Internal Medicine

## 2020-04-10 ENCOUNTER — Encounter (HOSPITAL_COMMUNITY): Payer: Self-pay

## 2020-04-10 ENCOUNTER — Emergency Department (HOSPITAL_COMMUNITY): Payer: PPO

## 2020-04-10 ENCOUNTER — Inpatient Hospital Stay (HOSPITAL_COMMUNITY): Payer: PPO

## 2020-04-10 ENCOUNTER — Other Ambulatory Visit: Payer: Self-pay

## 2020-04-10 DIAGNOSIS — Z79899 Other long term (current) drug therapy: Secondary | ICD-10-CM | POA: Diagnosis not present

## 2020-04-10 DIAGNOSIS — Z981 Arthrodesis status: Secondary | ICD-10-CM | POA: Diagnosis not present

## 2020-04-10 DIAGNOSIS — N184 Chronic kidney disease, stage 4 (severe): Secondary | ICD-10-CM | POA: Diagnosis not present

## 2020-04-10 DIAGNOSIS — E119 Type 2 diabetes mellitus without complications: Secondary | ICD-10-CM

## 2020-04-10 DIAGNOSIS — U071 COVID-19: Secondary | ICD-10-CM | POA: Diagnosis not present

## 2020-04-10 DIAGNOSIS — Z86718 Personal history of other venous thrombosis and embolism: Secondary | ICD-10-CM

## 2020-04-10 DIAGNOSIS — E785 Hyperlipidemia, unspecified: Secondary | ICD-10-CM | POA: Diagnosis present

## 2020-04-10 DIAGNOSIS — E872 Acidosis, unspecified: Secondary | ICD-10-CM

## 2020-04-10 DIAGNOSIS — Z6830 Body mass index (BMI) 30.0-30.9, adult: Secondary | ICD-10-CM | POA: Diagnosis not present

## 2020-04-10 DIAGNOSIS — Z8616 Personal history of COVID-19: Secondary | ICD-10-CM | POA: Diagnosis not present

## 2020-04-10 DIAGNOSIS — Z7982 Long term (current) use of aspirin: Secondary | ICD-10-CM

## 2020-04-10 DIAGNOSIS — Z8249 Family history of ischemic heart disease and other diseases of the circulatory system: Secondary | ICD-10-CM

## 2020-04-10 DIAGNOSIS — I6529 Occlusion and stenosis of unspecified carotid artery: Secondary | ICD-10-CM | POA: Diagnosis not present

## 2020-04-10 DIAGNOSIS — Z8719 Personal history of other diseases of the digestive system: Secondary | ICD-10-CM

## 2020-04-10 DIAGNOSIS — R5383 Other fatigue: Secondary | ICD-10-CM | POA: Diagnosis not present

## 2020-04-10 DIAGNOSIS — G4733 Obstructive sleep apnea (adult) (pediatric): Secondary | ICD-10-CM | POA: Diagnosis present

## 2020-04-10 DIAGNOSIS — Z96641 Presence of right artificial hip joint: Secondary | ICD-10-CM | POA: Diagnosis not present

## 2020-04-10 DIAGNOSIS — Z87891 Personal history of nicotine dependence: Secondary | ICD-10-CM

## 2020-04-10 DIAGNOSIS — N179 Acute kidney failure, unspecified: Secondary | ICD-10-CM | POA: Diagnosis present

## 2020-04-10 DIAGNOSIS — N281 Cyst of kidney, acquired: Secondary | ICD-10-CM | POA: Diagnosis not present

## 2020-04-10 DIAGNOSIS — E1122 Type 2 diabetes mellitus with diabetic chronic kidney disease: Secondary | ICD-10-CM | POA: Diagnosis present

## 2020-04-10 DIAGNOSIS — E860 Dehydration: Principal | ICD-10-CM | POA: Diagnosis present

## 2020-04-10 DIAGNOSIS — R531 Weakness: Secondary | ICD-10-CM | POA: Diagnosis not present

## 2020-04-10 DIAGNOSIS — E669 Obesity, unspecified: Secondary | ICD-10-CM | POA: Diagnosis present

## 2020-04-10 DIAGNOSIS — R05 Cough: Secondary | ICD-10-CM | POA: Diagnosis not present

## 2020-04-10 DIAGNOSIS — N400 Enlarged prostate without lower urinary tract symptoms: Secondary | ICD-10-CM | POA: Diagnosis present

## 2020-04-10 DIAGNOSIS — Z20822 Contact with and (suspected) exposure to covid-19: Secondary | ICD-10-CM | POA: Diagnosis not present

## 2020-04-10 LAB — CBG MONITORING, ED
Glucose-Capillary: 83 mg/dL (ref 70–99)
Glucose-Capillary: 88 mg/dL (ref 70–99)

## 2020-04-10 LAB — TYPE AND SCREEN
ABO/RH(D): A POS
Antibody Screen: NEGATIVE

## 2020-04-10 LAB — BASIC METABOLIC PANEL
Anion gap: 16 — ABNORMAL HIGH (ref 5–15)
BUN: 75 mg/dL — ABNORMAL HIGH (ref 8–23)
CO2: 20 mmol/L — ABNORMAL LOW (ref 22–32)
Calcium: 8.7 mg/dL — ABNORMAL LOW (ref 8.9–10.3)
Chloride: 104 mmol/L (ref 98–111)
Creatinine, Ser: 3.85 mg/dL — ABNORMAL HIGH (ref 0.61–1.24)
GFR calc Af Amer: 16 mL/min — ABNORMAL LOW (ref >60)
GFR calc non Af Amer: 14 mL/min — ABNORMAL LOW (ref >60)
Glucose, Bld: 95 mg/dL (ref 70–99)
Potassium: 4.7 mmol/L (ref 3.5–5.1)
Sodium: 140 mmol/L (ref 135–145)

## 2020-04-10 LAB — CBC
HCT: 43.5 % (ref 39.0–52.0)
Hemoglobin: 14.3 g/dL (ref 13.0–17.0)
MCH: 29.1 pg (ref 26.0–34.0)
MCHC: 32.9 g/dL (ref 30.0–36.0)
MCV: 88.6 fL (ref 80.0–100.0)
Platelets: 200 10*3/uL (ref 150–400)
RBC: 4.91 MIL/uL (ref 4.22–5.81)
RDW: 13.8 % (ref 11.5–15.5)
WBC: 7.5 10*3/uL (ref 4.0–10.5)
nRBC: 0 % (ref 0.0–0.2)

## 2020-04-10 MED ORDER — INSULIN ASPART 100 UNIT/ML ~~LOC~~ SOLN
0.0000 [IU] | Freq: Every day | SUBCUTANEOUS | Status: DC
  Filled 2020-04-10: qty 0.05

## 2020-04-10 MED ORDER — ASPIRIN EC 81 MG PO TBEC
81.0000 mg | DELAYED_RELEASE_TABLET | Freq: Every evening | ORAL | Status: DC
  Administered 2020-04-11 (×2): 81 mg via ORAL
  Filled 2020-04-10 (×2): qty 1

## 2020-04-10 MED ORDER — PRAVASTATIN SODIUM 20 MG PO TABS
80.0000 mg | ORAL_TABLET | Freq: Every day | ORAL | Status: DC
  Administered 2020-04-11 (×2): 80 mg via ORAL
  Filled 2020-04-10 (×2): qty 4

## 2020-04-10 MED ORDER — VITAMIN D 25 MCG (1000 UNIT) PO TABS
1000.0000 [IU] | ORAL_TABLET | Freq: Every day | ORAL | Status: DC
  Administered 2020-04-11 – 2020-04-12 (×2): 1000 [IU] via ORAL
  Filled 2020-04-10 (×2): qty 1

## 2020-04-10 MED ORDER — ACETAMINOPHEN 325 MG PO TABS: 650.0000 mg | ORAL_TABLET | Freq: Four times a day (QID) | ORAL | Status: DC | PRN

## 2020-04-10 MED ORDER — LACTATED RINGERS IV BOLUS
1000.0000 mL | Freq: Once | INTRAVENOUS | Status: AC
  Administered 2020-04-10: 1000 mL via INTRAVENOUS

## 2020-04-10 MED ORDER — PROSIGHT PO TABS
1.0000 | ORAL_TABLET | Freq: Every day | ORAL | Status: DC
  Administered 2020-04-11 (×2): 1 via ORAL
  Filled 2020-04-10: qty 1

## 2020-04-10 MED ORDER — CALCITRIOL 0.25 MCG PO CAPS
0.2500 ug | ORAL_CAPSULE | Freq: Every day | ORAL | Status: DC
  Administered 2020-04-11 – 2020-04-12 (×2): 0.25 ug via ORAL
  Filled 2020-04-10 (×2): qty 1

## 2020-04-10 MED ORDER — ASCORBIC ACID 500 MG PO TABS
250.0000 mg | ORAL_TABLET | Freq: Every day | ORAL | Status: DC
  Administered 2020-04-11 – 2020-04-12 (×2): 250 mg via ORAL
  Filled 2020-04-10 (×2): qty 1

## 2020-04-10 MED ORDER — INSULIN ASPART 100 UNIT/ML ~~LOC~~ SOLN
0.0000 [IU] | Freq: Three times a day (TID) | SUBCUTANEOUS | Status: DC
  Filled 2020-04-10: qty 0.09

## 2020-04-10 MED ORDER — VITAMIN B-12 100 MCG PO TABS
100.0000 ug | ORAL_TABLET | Freq: Every day | ORAL | Status: DC
  Administered 2020-04-11 – 2020-04-12 (×2): 100 ug via ORAL
  Filled 2020-04-10 (×2): qty 1

## 2020-04-10 MED ORDER — HEPARIN SODIUM (PORCINE) 5000 UNIT/ML IJ SOLN
5000.0000 [IU] | Freq: Three times a day (TID) | INTRAMUSCULAR | Status: DC
  Administered 2020-04-11 – 2020-04-12 (×5): 5000 [IU] via SUBCUTANEOUS
  Filled 2020-04-10 (×5): qty 1

## 2020-04-10 MED ORDER — ACETAMINOPHEN 650 MG RE SUPP: 650.0000 mg | Freq: Four times a day (QID) | RECTAL | Status: DC | PRN

## 2020-04-10 MED ORDER — SODIUM CHLORIDE 0.9 % IV SOLN: INTRAVENOUS | Status: DC

## 2020-04-10 NOTE — ED Provider Notes (Signed)
[facility] DEPT Provider Note   CSN: [id number] Arrival date & time: 04/10/20  1916     History Chief Complaint  Patient presents with  . Abnormal Lab    Ian Rhodes is a 84 y.o. male.  Patient is an 84 year old male with a history of diabetes, DVT, hyperlipidemia and carotid artery stenosis who is presenting today due to abnormal creatinine and dehydration.  Patient reports on Memorial Day he and his wife both developed Covid.  He had received his full vaccination earlier in the year.  He states initially he was feeling general malaise, cough, loss of taste and smell and decreased oral intake.  He states he still has to force himself to eat or drink but denies any vomiting or diarrhea.  He has still been producing urine but has known history of chronic kidney disease with renal function at baseline at 2.4 per patient.  Because he was still feeling generalized weakness and feeling rundown he was seen by his doctor and had blood work drawn and they called him today reporting that his renal function was significantly elevated and he needed to go to the hospital.  He denies any ongoing fever and reports his cough is a most resolved.  The history is provided by the patient.  Abnormal Lab Time since result:  Cr of 3.86 Patient referred by:  PCP Resulting agency:  External Result type: chemistry   Chemistry:    Creatinine:  High      Past Medical History:  Diagnosis Date  . BPH (benign prostatic hypertrophy)   . Carotid artery stenosis   . Colon polyps   . Diabetes mellitus    Type II  . DVT (deep venous thrombosis) ([place]) 1995   r leg following surgery   . ED (erectile dysfunction)   . Hyperlipemia   . Kidney stones   . Macular degeneration   . Obesity   . Sleep apnea    very  mild not on CPAP    Patient Active Problem List   Diagnosis Date Noted  . Chest pain 01/08/2014    Past Surgical History:  Procedure Laterality Date  .  EYE SURGERY     two surgeries  . KNEE ARTHROSCOPY, MEDIAL PATELLO FEMORAL LIGAMENT RECONSTRUCTION W/ HAMSTRING GRAFT     bilateral  . SPINAL FUSION    . TOTAL HIP ARTHROPLASTY  09/21/2011   Procedure: TOTAL HIP ARTHROPLASTY;  Surgeon: Newt Minion, MD;  Location: [facility];  Service: Orthopedics;  Laterality: Right;  Right Total Hip Arthroplasty       Family History  Problem Relation Age of Onset  . Heart attack Father   . CAD Father     Social History   Tobacco Use  . Smoking status: Former Research scientist (life sciences)  . Smokeless tobacco: Former Systems developer    Quit date: 06/13/1994  Substance Use Topics  . Alcohol use: No  . Drug use: No    Home Medications Prior to Admission medications   Medication Sig Start Date End Date Taking? Authorizing Provider  acetaminophen (TYLENOL) 500 MG tablet Take 500 mg by mouth every 6 (six) hours as needed for moderate pain.   Yes [provider]  aspirin 81 MG tablet Take 81 mg by mouth every evening.    Yes [provider]  calcitRIOL (ROCALTROL) 0.25 MCG capsule Take 0.25 mcg by mouth daily. 03/26/20  Yes [provider]  cholecalciferol (VITAMIN D3) 25 MCG (1000 UNIT) tablet Take 1,000 Units  by mouth daily.   Yes [provider]  glipiZIDE (GLUCOTROL XL) 5 MG 24 hr tablet Take 5 mg by mouth every morning. 03/10/20  Yes [provider]  lisinopril (ZESTRIL) 5 MG tablet Take 5 mg by mouth at bedtime. 03/26/20  Yes [provider]  Multiple Vitamins-Minerals (ICAPS) TABS Take 1 tablet by mouth daily.   Yes [provider]  Multiple Vitamins-Minerals (ZINC PO) Take 1 tablet by mouth daily.   Yes [provider]  potassium citrate (UROCIT-K) 10 MEQ (1080 MG) SR tablet Take 20 mEq by mouth daily. 03/19/20  Yes [provider]  pravastatin (PRAVACHOL) 80 MG tablet Take 80 mg by mouth daily. 01/04/20  Yes [provider]  vitamin B-12 (CYANOCOBALAMIN) 100 MCG tablet Take 100 mcg by mouth  daily.   Yes [provider]  vitamin C (ASCORBIC ACID) 250 MG tablet Take 250 mg by mouth daily.   Yes [provider]  pravastatin (PRAVACHOL) 40 MG tablet Take 80 mg by mouth at bedtime.   Patient not taking: Reported on 04/10/2020    [provider]    Allergies    Patient has no known allergies.  Review of Systems   Review of Systems  All other systems reviewed and are negative.   Physical Exam Updated Vital Signs BP 111/71   Pulse 91   Temp (!) 97.5 F (36.4 C) (Oral)   Resp 17   SpO2 98%   Physical Exam Vitals and nursing note reviewed.  Constitutional:      General: He is not in acute distress.    Appearance: Normal appearance. He is well-developed. He is obese.  HENT:     Head: Normocephalic and atraumatic.     Mouth/Throat:     Mouth: Mucous membranes are moist.  Eyes:     Conjunctiva/sclera: Conjunctivae normal.     Pupils: Pupils are equal, round, and reactive to light.  Cardiovascular:     Rate and Rhythm: Normal rate and regular rhythm.     Heart sounds: No murmur heard.   Pulmonary:     Effort: Pulmonary effort is normal. No respiratory distress.     Breath sounds: Normal breath sounds. Decreased air movement present. No wheezing or rales.     Comments: Decreased breath sounds in the lower lobes Abdominal:     General: There is no distension.     Palpations: Abdomen is soft.     Tenderness: There is no abdominal tenderness. There is no right CVA tenderness, left CVA tenderness, guarding or rebound.  Musculoskeletal:        General: No tenderness. Normal range of motion.     Cervical back: Normal range of motion and neck supple.     Right lower leg: No edema.     Left lower leg: No edema.  Skin:    General: Skin is warm and dry.     Findings: No erythema or rash.  Neurological:     General: No focal deficit present.     Mental Status: He is alert and oriented to person, place, and time. Mental status is at baseline.    Psychiatric:        Mood and Affect: Mood normal.        Behavior: Behavior normal.        Thought Content: Thought content normal.      ED Results / Procedures / Treatments   Labs (all labs ordered are listed, but only abnormal results are displayed)  Labs Reviewed  BASIC METABOLIC PANEL - Abnormal; Notable for the following components:      Result Value   CO2 20 (*)    BUN 75 (*)    Creatinine, Ser 3.85 (*)    Calcium 8.7 (*)    GFR calc non Af Amer 14 (*)    GFR calc Af Amer 16 (*)    Anion gap 16 (*)    All other components within normal limits  SARS CORONAVIRUS 2 BY RT PCR (HOSPITAL ORDER, [facility])  CBC  URINALYSIS, ROUTINE W REFLEX MICROSCOPIC  CBG MONITORING, ED  CBG MONITORING, ED  TYPE AND SCREEN    EKG None  Radiology No results found.  Procedures Procedures (including critical care time)  Medications Ordered in ED Medications  lactated ringers bolus 1,000 mL (1,000 mLs Intravenous New Bag/Given 04/10/20 2059)    ED Course  I have reviewed the triage vital signs and the nursing notes.  Pertinent labs & imaging results that were available during my care of the patient were reviewed by me and considered in my medical decision making (see chart for details).    MDM Rules/Calculators/A&P                          Elderly male presenting today due to worsening renal function.  Patient has a history of stage IV chronic kidney disease with creatinine of 2.4 per the patient he recently had Covid approximately 3 weeks ago and still is having ongoing weakness and poor oral intake due to loss of taste and smell.  He reports the cough is almost completely resolved and he denies any shortness of breath or fever at this time.  He was seen by his doctor and had labs drawn within the last few days and they called him today and requested that he go to the emergency room due to elevated creatinine.  This is thought to be due to a prerenal  causes from poor oral intake.  However patient is also on lisinopril.  Potassium is normal at 4.7 today.  Patient does have a mild anion gap of 16.  He in general is in no acute distress and vital signs are reassuring.  No evidence of fluid overload at this time.  Patient was started on IV fluids and will admit for further evaluation.  We will also need to hold lisinopril at this time.  MDM Number of Diagnoses or Management Options   Amount and/or Complexity of Data Reviewed Clinical lab tests: ordered and reviewed Tests in the radiology section of CPT: ordered and reviewed Decide to obtain previous medical records or to obtain history from someone other than the patient: yes Obtain history from someone other than the patient: no Review and summarize past medical records: yes Discuss the patient with other providers: yes Independent visualization of images, tracings, or specimens: yes  Risk of Complications, Morbidity, and/or Mortality Presenting problems: moderate Diagnostic procedures: low Management options: low  Patient Progress Patient progress: stable  Final Clinical Impression(s) / ED Diagnoses Final diagnoses:  AKI (acute kidney injury) ([place])  Dehydration    Rx / DC Orders ED Discharge Orders    None       Blanchie Dessert, MD 04/10/20 2130

## 2020-04-10 NOTE — ED Notes (Signed)
US at bedside

## 2020-04-10 NOTE — ED Triage Notes (Signed)
Pt reports that his PCP sent him over for fluid. Reports that his creatinine resulted above 3 today. States that's up from 2.6 months ago. A&Ox.4. Ambulatory.

## 2020-04-10 NOTE — H&P (Signed)
History and Physical    Ian Rhodes JXB:[id number] DOB: 06/23/1936 DOA: 04/10/2020  PCP: London Pepper, MD Patient coming from: Home  Chief Complaint: Abnormal lab  HPI: Ian Rhodes is a 84 y.o. male with medical history significant of BPH, carotid artery stenosis, colon polyps, type 2 diabetes, history of DVT following surgery, hyperlipidemia, sleep apnea not on CPAP presenting to the ED for evaluation of abnormal creatinine. Patient states he had Covid symptoms such as weakness, cough, and poor oral intake starting memorial day. About 2 to 3 days later he went to get tested and was positive. States he has now recovered from his infection. His oxygen saturation never dropped and he was not hospitalized for Covid. He went to see his PCP to get routine labs today and was told his creatinine was 4.8. States he has stage IV kidney disease from diabetes and his creatinine is normally around 2.4. His nephrologist is Dr. Jimmy Footman. No other complaints. Denies shortness of breath, chest pain, nausea, vomiting, or diarrhea.  ED Course: Afebrile.  Labs showing no leukocytosis.  Bicarb 20, anion gap 16.  Blood glucose 95.  BUN 75, creatinine 3.8.  No recent labs for comparison.  Creatinine was 1.1 in 2012.  Chest x-ray pending.  Patient received 1 L LR bolus.  Review of Systems:  All systems reviewed and apart from history of presenting illness, are negative.  Past Medical History:  Diagnosis Date  . BPH (benign prostatic hypertrophy)   . Carotid artery stenosis   . Colon polyps   . Diabetes mellitus    Type II  . DVT (deep venous thrombosis) ([place]) 1995   r leg following surgery   . ED (erectile dysfunction)   . Hyperlipemia   . Kidney stones   . Macular degeneration   . Obesity   . Sleep apnea    very  mild not on CPAP    Past Surgical History:  Procedure Laterality Date  . EYE SURGERY     two surgeries  . KNEE ARTHROSCOPY, MEDIAL PATELLO FEMORAL LIGAMENT  RECONSTRUCTION W/ HAMSTRING GRAFT     bilateral  . SPINAL FUSION    . TOTAL HIP ARTHROPLASTY  09/21/2011   Procedure: TOTAL HIP ARTHROPLASTY;  Surgeon: Newt Minion, MD;  Location: [facility];  Service: Orthopedics;  Laterality: Right;  Right Total Hip Arthroplasty     reports that he has quit smoking. He quit smokeless tobacco use about 25 years ago. He reports that he does not drink alcohol and does not use drugs.  No Known Allergies  Family History  Problem Relation Age of Onset  . Heart attack Father   . CAD Father     Prior to Admission medications   Medication Sig Start Date End Date Taking? Authorizing Provider  acetaminophen (TYLENOL) 500 MG tablet Take 500 mg by mouth every 6 (six) hours as needed for moderate pain.   Yes [provider]  aspirin 81 MG tablet Take 81 mg by mouth every evening.    Yes [provider]  calcitRIOL (ROCALTROL) 0.25 MCG capsule Take 0.25 mcg by mouth daily. 03/26/20  Yes [provider]  cholecalciferol (VITAMIN D3) 25 MCG (1000 UNIT) tablet Take 1,000 Units by mouth daily.   Yes [provider]  glipiZIDE (GLUCOTROL XL) 5 MG 24 hr tablet Take 5 mg by mouth every morning. 03/10/20  Yes [provider]  lisinopril (ZESTRIL) 5 MG tablet Take 5 mg by mouth at bedtime. 03/26/20  Yes [provider]  Multiple Vitamins-Minerals (ICAPS) TABS Take 1 tablet by mouth daily.   Yes [provider]  Multiple Vitamins-Minerals (ZINC PO) Take 1 tablet by mouth daily.   Yes [provider]  potassium citrate (UROCIT-K) 10 MEQ (1080 MG) SR tablet Take 20 mEq by mouth daily. 03/19/20  Yes [provider]  pravastatin (PRAVACHOL) 80 MG tablet Take 80 mg by mouth daily. 01/04/20  Yes [provider]  vitamin B-12 (CYANOCOBALAMIN) 100 MCG tablet Take 100 mcg by mouth daily.   Yes [provider]  vitamin C (ASCORBIC ACID) 250 MG tablet Take 250 mg by mouth daily.   Yes [provider]  pravastatin (PRAVACHOL) 40 MG tablet Take 80 mg by mouth at bedtime.   Patient not taking: Reported on 04/10/2020    [provider]    Physical Exam: Vitals:   04/10/20 1926 04/10/20 2100 04/10/20 2115 04/10/20 2154  BP: 113/77 111/71 114/73 114/73  Pulse: (!) 105 91 94 87  Resp: 19 17 15 16   Temp: (!) 97.5 F (36.4 C)     TempSrc: Oral     SpO2: 99% 98% 99% 92%    Physical Exam Constitutional:      General: He is not in acute distress. HENT:     Head: Normocephalic.     Mouth/Throat:     Mouth: Mucous membranes are moist.  Eyes:     Extraocular Movements: Extraocular movements intact.  Cardiovascular:     Rate and Rhythm: Normal rate and regular rhythm.     Pulses: Normal pulses.  Pulmonary:     Effort: Pulmonary effort is normal. No respiratory distress.     Breath sounds: Normal breath sounds. No wheezing or rales.  Abdominal:     General: Bowel sounds are normal. There is no distension.     Palpations: Abdomen is soft.     Tenderness: There is no abdominal tenderness. There is no guarding.  Musculoskeletal:        General: No swelling.     Cervical back: Normal range of motion and neck supple.  Skin:    General: Skin is warm and dry.  Neurological:     Mental Status: He is alert and oriented to person, place, and time.     Labs on Admission: I have personally reviewed following labs and imaging studies  CBC: Recent Labs  Lab 04/10/20 1940  WBC 7.5  HGB 14.3  HCT 43.5  MCV 88.6  PLT 676   Basic Metabolic Panel: Recent Labs  Lab 04/10/20 1940  NA 140  K 4.7  CL 104  CO2 20*  GLUCOSE 95  BUN 75*  CREATININE 3.85*  CALCIUM 8.7*   GFR: CrCl cannot be calculated (Unknown ideal weight.). Liver Function Tests: No results for input(s): AST, ALT, ALKPHOS, BILITOT, PROT, ALBUMIN in the last 168 hours. No results for input(s): LIPASE, AMYLASE in the last 168 hours. No results for input(s): AMMONIA in the last 168  hours. Coagulation Profile: No results for input(s): INR, PROTIME in the last 168 hours. Cardiac Enzymes: No results for input(s): CKTOTAL, CKMB, CKMBINDEX, TROPONINI in the last 168 hours. BNP (last 3 results) No results for input(s): PROBNP in the last 8760 hours. HbA1C: No results for input(s): HGBA1C in the last 72 hours. CBG: Recent Labs  Lab 04/10/20 1933  GLUCAP 88   Lipid Profile: No results for input(s): CHOL, HDL, LDLCALC, TRIG, CHOLHDL, LDLDIRECT in the last 72 hours. Thyroid Function Tests:  No results for input(s): TSH, T4TOTAL, FREET4, T3FREE, THYROIDAB in the last 72 hours. Anemia Panel: No results for input(s): VITAMINB12, FOLATE, FERRITIN, TIBC, IRON, RETICCTPCT in the last 72 hours. Urine analysis:    Component Value Date/Time   COLORURINE YELLOW 05/10/2010 1559   APPEARANCEUR CLEAR 05/10/2010 1559   LABSPEC 1.023 05/10/2010 1559   PHURINE 5.0 05/10/2010 1559   GLUCOSEU NEGATIVE 05/10/2010 1559   HGBUR LARGE (A) 05/10/2010 1559   BILIRUBINUR NEGATIVE 05/10/2010 1559   KETONESUR NEGATIVE 05/10/2010 1559   PROTEINUR NEGATIVE 05/10/2010 1559   UROBILINOGEN 0.2 05/10/2010 1559   NITRITE NEGATIVE 05/10/2010 1559   LEUKOCYTESUR NEGATIVE 05/10/2010 1559    Radiological Exams on Admission: DG Chest Port 1 View  Result Date: 04/10/2020 CLINICAL DATA:  Cough. EXAM: PORTABLE CHEST 1 VIEW COMPARISON:  09/14/2011 FINDINGS: Heart is normal in size.The cardiomediastinal contours are normal. The lungs are clear. Pulmonary vasculature is normal. No consolidation, pleural effusion, or pneumothorax. No acute osseous abnormalities are seen. Thoracic spondylosis. IMPRESSION: No acute chest findings. Electronically Signed   By: Keith Rake M.D.   On: 04/10/2020 21:48    Assessment/Plan Principal Problem:   AKI (acute kidney injury) ([place]) Active Problems:   Metabolic acidosis   History of COVID-19   Type 2 diabetes mellitus ([place])   Hyperlipidemia   AKI on CKD stage  IV: Likely prerenal due to dehydration in setting of recent Covid infection and home ACE inhibitor use. BUN 75, creatinine 3.8. 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 -IV fluid hydration.  Monitor renal function and urine output.  Check urine sodium and creatinine.  Order renal ultrasound.  Hold ACE inhibitor.  Avoid nephrotoxic agents/contrast. Consult nephrology if no improvement in renal function with IV fluid hydration.  Mild high anion gap metabolic acidosis: Likely due to AKI.  Bicarb 20, anion gap 16. -IV fluid hydration and repeat BMP in a.m.  Recent Covid infection: Patient reports history of Covid symptoms which started approximately 18 days ago. He has now recovered from his infection and feeling better. It seems his infection is mild as he was never hospitalized. Currently not tachypneic or hypoxic. Chest x-ray not suggestive of pneumonia. -Will not retest for Covid as the test can be positive for up to 3 months from the date of infection. Since he is currently asymptomatic and well past the 10-day isolation period, isolation precautions no longer needed.  Noninsulin-dependent type 2 diabetes -Check A1c.  Sliding scale insulin sensitive ACHS and CBG checks.  Hyperlipidemia -Continue home pravastatin  DVT prophylaxis: Subcutaneous heparin Code Status: Patient wishes to be full code. Family Communication: Results from diagnostic testing and treatment plan have been discussed with the patient. Disposition Plan: Status is: Inpatient  Remains inpatient appropriate because:Ongoing diagnostic testing needed not appropriate for outpatient work up, IV treatments appropriate due to intensity of illness or inability to take PO and Inpatient level of care appropriate due to severity of illness   Dispo: The patient is from: Home              Anticipated d/c is to: Home              Anticipated d/c date is: 3 days               Patient currently is not medically stable to d/c.  The medical decision making on this patient was of high complexity and the patient is at high risk for clinical  deterioration, therefore this is a level 3 visit.  Shela Leff MD [facility]  If 7PM-7AM, please contact night-coverage [email or web address]  04/10/2020, 10:02 PM

## 2020-04-10 NOTE — ED Notes (Signed)
Pink top and Blue top also collected

## 2020-04-11 DIAGNOSIS — E1122 Type 2 diabetes mellitus with diabetic chronic kidney disease: Secondary | ICD-10-CM

## 2020-04-11 DIAGNOSIS — E785 Hyperlipidemia, unspecified: Secondary | ICD-10-CM

## 2020-04-11 DIAGNOSIS — N179 Acute kidney failure, unspecified: Secondary | ICD-10-CM

## 2020-04-11 DIAGNOSIS — N184 Chronic kidney disease, stage 4 (severe): Secondary | ICD-10-CM

## 2020-04-11 DIAGNOSIS — E872 Acidosis: Secondary | ICD-10-CM

## 2020-04-11 DIAGNOSIS — Z8616 Personal history of COVID-19: Secondary | ICD-10-CM

## 2020-04-11 LAB — GLUCOSE, CAPILLARY
Glucose-Capillary: 81 mg/dL (ref 70–99)
Glucose-Capillary: 85 mg/dL (ref 70–99)
Glucose-Capillary: 101 mg/dL — ABNORMAL HIGH (ref 70–99)
Glucose-Capillary: 103 mg/dL — ABNORMAL HIGH (ref 70–99)

## 2020-04-11 LAB — URINALYSIS, ROUTINE W REFLEX MICROSCOPIC
Bilirubin Urine: NEGATIVE
Glucose, UA: NEGATIVE mg/dL
Hgb urine dipstick: NEGATIVE
Ketones, ur: 5 mg/dL — AB
Leukocytes,Ua: NEGATIVE
Nitrite: NEGATIVE
Protein, ur: NEGATIVE mg/dL
Specific Gravity, Urine: 1.013 (ref 1.005–1.030)
pH: 5 (ref 5.0–8.0)

## 2020-04-11 LAB — BASIC METABOLIC PANEL
Anion gap: 9 (ref 5–15)
BUN: 75 mg/dL — ABNORMAL HIGH (ref 8–23)
CO2: 24 mmol/L (ref 22–32)
Calcium: 8.1 mg/dL — ABNORMAL LOW (ref 8.9–10.3)
Chloride: 103 mmol/L (ref 98–111)
Creatinine, Ser: 3.73 mg/dL — ABNORMAL HIGH (ref 0.61–1.24)
GFR calc Af Amer: 16 mL/min — ABNORMAL LOW (ref >60)
GFR calc non Af Amer: 14 mL/min — ABNORMAL LOW (ref >60)
Glucose, Bld: 70 mg/dL (ref 70–99)
Potassium: 4.6 mmol/L (ref 3.5–5.1)
Sodium: 136 mmol/L (ref 135–145)

## 2020-04-11 LAB — CREATININE, URINE, RANDOM: Creatinine, Urine: 133.48 mg/dL

## 2020-04-11 LAB — ABO/RH: ABO/RH(D): A POS

## 2020-04-11 LAB — SODIUM, URINE, RANDOM: Sodium, Ur: 34 mmol/L

## 2020-04-11 MED ORDER — PROSIGHT PO TABS
1.0000 | ORAL_TABLET | Freq: Two times a day (BID) | ORAL | Status: DC
  Administered 2020-04-11 – 2020-04-12 (×2): 1 via ORAL
  Filled 2020-04-11 (×2): qty 1

## 2020-04-11 MED ORDER — SODIUM CHLORIDE 0.9 % IV SOLN: INTRAVENOUS | Status: DC

## 2020-04-11 NOTE — Plan of Care (Signed)
Plan of care reviewed and discussed with the patient. 

## 2020-04-11 NOTE — Progress Notes (Signed)
PROGRESS NOTE    Ian Rhodes  HEN:[id number] DOB: August 07, 1936 DOA: 04/10/2020 PCP: London Pepper, MD   Brief Narrative: HPI per Dr. Shela Leff on 04/10/20 Ian Rhodes is a 84 y.o. male with medical history significant of BPH, carotid artery stenosis, colon polyps, type 2 diabetes, history of DVT following surgery, hyperlipidemia, sleep apnea not on CPAP presenting to the ED for evaluation of abnormal creatinine. Patient states he had Covid symptoms such as weakness, cough, and poor oral intake starting memorial day. About 2 to 3 days later he went to get tested and was positive. States he has now recovered from his infection. His oxygen saturation never dropped and he was not hospitalized for Covid. He went to see his PCP to get routine labs today and was told his creatinine was 4.8. States he has stage IV kidney disease from diabetes and his creatinine is normally around 2.4. His nephrologist is Dr. Jimmy Footman. No other complaints. Denies shortness of breath, chest pain, nausea, vomiting, or diarrhea.  ED Course: Afebrile.  Labs showing no leukocytosis.  Bicarb 20, anion gap 16.  Blood glucose 95.  BUN 75, creatinine 3.8.  No recent labs for comparison.  Creatinine was 1.1 in 2012.  **Interim History    Assessment & Plan:   Principal Problem:   AKI (acute kidney injury) ([place]) Active Problems:   Metabolic acidosis   History of COVID-19   Type 2 diabetes mellitus ([place])   Hyperlipidemia  AKI on CKD stage IV -Likely prerenal due to dehydration in setting of recent Covid infection and home ACE inhibitor use.  -BUN 75, creatinine 3.85 on admission.  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 -Urinalysis showed a clear appearance with negative bilirubin, yellow color urine, negative glucose, negative hemoglobin, 5 ketones, negative  leukocytes, negative nitrites -C/w IV fluid hydration and he received a 1 L bolus of lactated Ringer's and was given on 25 mils per hour of fluid for 12 hours and this is expired and I have renewed this for at 75 MLS per hour   -Continue to Monitor renal function and urine output.   -Check urine sodium and creatinine and urine sodium was 34, urine creatinine was 133.48.   -Renal ultrasound done and showed "No hydronephrosis or obstructive uropathy. Bilateral renal parenchymal thinning with increased renalechogenicity consistent with chronic medical renal disease. Bilateral renal cysts."   -Continue Hold ACE inhibitor.   -Avoid Nephrotoxic medications, contrast dyes, hypotension and renally dose medications -Consult nephrology if no improvement in renal function with IV fluid hydration.  Mild High Anion Gap Metabolic Acidosis -Likely due to AKI.  Bicarb 20, anion gap 16; it is now improved and CO2 is 24, anion gap is 9, chloride levels 103 -Continue IV fluid hydration and repeat CMP in a.m.  Recent Covid Infection -Patient reports history of Covid symptoms which started approximately 18 days ago.  -He has now recovered from his infection and feeling better.  -It seems his infection is mild as he was never hospitalized.  He has been vaccinated for Covid -Currently not tachypneic or hypoxic. Chest x-ray not suggestive of pneumonia. -Will not retest for Covid as the test can be positive for up to 3 months from the date of infection.  -Since he is currently asymptomatic and well past the 10-day isolation period, isolation precautions no longer needed.  Noninsulin-dependent type 2 diabetes -Check  A1c in the AM.   -C/w Sliding scale insulin sensitive ACHS and CBG checks. -CBG's ranging from 81-88  Hyperlipidemia -Continue home Pravastatin 80 mg po qHS  OSA -Not on CPAP  Hx of Carotid Stenosis -C/w ASA 81 mg po Daily and Pravastatin 80 mg po qHS  Obesity -Estimated body mass index  is 30.5 kg/m as calculated from the following:   Height as of this encounter: 5\' 11"  (1.803 m).   Weight as of this encounter: 99.2 kg. -Continued Weight Loss and Dietary Counseling  DVT prophylaxis: Heparin 5,000 units sq q8h Code Status: FULL CODE  Family Communication: No family present at bedside  Disposition Plan: Pending further improvement of his creatinine back to baseline  Status is: Inpatient  Remains inpatient appropriate because:Unsafe d/c plan, IV treatments appropriate due to intensity of illness or inability to take PO and Inpatient level of care appropriate due to severity of illness   Dispo: The patient is from: Home              Anticipated d/c is to: TBD              Anticipated d/c date is: 2 days              Patient currently is not medically stable to d/c.  Consultants:   None   Procedures:  None  Antimicrobials:  Anti-infectives (From admission, onward)   None      Subjective: Seen and examined at bedside and patient was doing okay.  Denies any chest pain, shortness breath, nausea or vomiting.  Feels a little tired.  No other concerns or complaints at this time  Objective: Vitals:   04/11/20 0018 04/11/20 0115 04/11/20 0519 04/11/20 0940  BP:  117/75 121/71 115/72  Pulse:  95 80 85  Resp:  16 16 16   Temp:  97.8 F (36.6 C) 97.7 F (36.5 C) 97.8 F (36.6 C)  TempSrc:    Oral  SpO2:  100% 98% 97%  Weight: 99.2 kg     Height: 5\' 11"  (1.803 m)       Intake/Output Summary (Last 24 hours) at 04/11/2020 1326 Last data filed at 04/11/2020 1301 Gross per 24 hour  Intake 3522.51 ml  Output --  Net 3522.51 ml   Filed Weights   04/11/20 0018  Weight: 99.2 kg   Examination: Physical Exam:  Constitutional: WN/WD obese Caucasian male currently in NAD and appears calm  Eyes: Lids and conjunctivae normal, sclerae anicteric  ENMT: External Ears, Nose appear normal. Grossly normal hearing.  Neck: Appears normal, supple, no cervical masses,  normal ROM, no appreciable thyromegaly; no JVD Respiratory: Diminished to auscultation bilaterally, no wheezing, rales, rhonchi or crackles. Normal respiratory effort and patient is not tachypenic. No accessory muscle use.  Unlabored breathing Cardiovascular: RRR, no murmurs / rubs / gallops. S1 and S2 auscultated. No extremity edema. Abdomen: Soft, non-tender, distended secondary to body habitus. Bowel sounds positive.  GU: Deferred. Musculoskeletal: No clubbing / cyanosis of digits/nails. No joint deformity upper and lower extremities.  Skin: No rashes, lesions, ulcers on limited skin evaluation. No induration; Warm and dry.  Neurologic: CN 2-12 grossly intact with no focal deficits. Romberg sign and cerebellar reflexes not assessed.  Psychiatric: Normal judgment and insight. Alert and oriented x 3. Normal mood and appropriate affect.   Data Reviewed: I have personally reviewed following labs and imaging studies  CBC: Recent Labs  Lab 04/10/20 1940  WBC 7.5  HGB 14.3  HCT 43.5  MCV 88.6  PLT 294   Basic Metabolic Panel: Recent Labs  Lab 04/10/20 1940 04/11/20 0324  NA 140 136  K 4.7 4.6  CL 104 103  CO2 20* 24  GLUCOSE 95 70  BUN 75* 75*  CREATININE 3.85* 3.73*  CALCIUM 8.7* 8.1*   GFR: Estimated Creatinine Clearance: 18.3 mL/min (A) (by C-G formula based on SCr of 3.73 mg/dL (H)). Liver Function Tests: No results for input(s): AST, ALT, ALKPHOS, BILITOT, PROT, ALBUMIN in the last 168 hours. No results for input(s): LIPASE, AMYLASE in the last 168 hours. No results for input(s): AMMONIA in the last 168 hours. Coagulation Profile: No results for input(s): INR, PROTIME in the last 168 hours. Cardiac Enzymes: No results for input(s): CKTOTAL, CKMB, CKMBINDEX, TROPONINI in the last 168 hours. BNP (last 3 results) No results for input(s): PROBNP in the last 8760 hours. HbA1C: No results for input(s): HGBA1C in the last 72 hours. CBG: Recent Labs  Lab 04/10/20 1933  04/10/20 2333 04/11/20 0715 04/11/20 1140  GLUCAP 88 83 81 85   Lipid Profile: No results for input(s): CHOL, HDL, LDLCALC, TRIG, CHOLHDL, LDLDIRECT in the last 72 hours. Thyroid Function Tests: No results for input(s): TSH, T4TOTAL, FREET4, T3FREE, THYROIDAB in the last 72 hours. Anemia Panel: No results for input(s): VITAMINB12, FOLATE, FERRITIN, TIBC, IRON, RETICCTPCT in the last 72 hours. Sepsis Labs: No results for input(s): PROCALCITON, LATICACIDVEN in the last 168 hours.  No results found for this or any previous visit (from the past 240 hour(s)).   RN Pressure Injury Documentation:     Estimated body mass index is 30.5 kg/m as calculated from the following:   Height as of this encounter: 5\' 11"  (1.803 m).   Weight as of this encounter: 99.2 kg.  Malnutrition Type:      Malnutrition Characteristics:      Nutrition Interventions:    Radiology Studies: US RENAL  Result Date: 04/10/2020 CLINICAL DATA:  Acute kidney injury. EXAM: RENAL / URINARY TRACT ULTRASOUND COMPLETE COMPARISON:  Renal ultrasound 09/07/2015 FINDINGS: Right Kidney: Renal measurements: 13.0 x 6.1 x 6.3 cm = volume: 262 mL. Diffusely increased renal parenchymal echogenicity with mild thinning of the renal parenchyma. Renal cysts, largest measuring 2.3 and 2.2 cm. No evidence of solid lesion. No hydronephrosis. Left Kidney: Renal measurements: 11.1 x 5.1 x 4.4 cm = volume: 132 mL. Diffusely increased renal parenchymal echogenicity and renal cortical thinning. Renal cysts, largest measuring 4.8 cm. This cyst has a thin internal septation. No evidence of solid or suspicious lesion. No hydronephrosis. Previous stone is not well visualized on the current exam. Bladder: Appears normal for degree of bladder distention. Other: None. IMPRESSION: 1. No hydronephrosis or obstructive uropathy. 2. Bilateral renal parenchymal thinning with increased renal echogenicity consistent with chronic medical renal disease. 3.  Bilateral renal cysts. Electronically Signed   By: Keith Rake M.D.   On: 04/10/2020 23:23   DG Chest Port 1 View  Result Date: 04/10/2020 CLINICAL DATA:  Cough. EXAM: PORTABLE CHEST 1 VIEW COMPARISON:  09/14/2011 FINDINGS: Heart is normal in size.The cardiomediastinal contours are normal. The lungs are clear. Pulmonary vasculature is normal. No consolidation, pleural effusion, or pneumothorax. No acute osseous abnormalities are seen. Thoracic spondylosis. IMPRESSION: No acute chest findings. Electronically Signed   By: Keith Rake M.D.   On: 04/10/2020 21:48   Scheduled Meds: . vitamin C  250 mg Oral Daily  . aspirin EC  81 mg Oral QPM  . calcitRIOL  0.25 mcg Oral  Daily  . cholecalciferol  1,000 Units Oral Daily  . heparin  5,000 Units Subcutaneous Q8H  . insulin aspart  0-5 Units Subcutaneous QHS  . insulin aspart  0-9 Units Subcutaneous TID WC  . multivitamin  1 tablet Oral Daily  . pravastatin  80 mg Oral q1800  . vitamin B-12  100 mcg Oral Daily   Continuous Infusions:   LOS: 1 day   Kerney Elbe, DO [facility] PAGER is on [place]  If 7PM-7AM, please contact night-coverage [email or web address]

## 2020-04-11 NOTE — Plan of Care (Signed)
  Problem: Clinical Measurements: Goal: Respiratory complications will improve Outcome: Progressing   Problem: Clinical Measurements: Goal: Cardiovascular complication will be avoided Outcome: Progressing   Problem: Nutrition: Goal: Adequate nutrition will be maintained Outcome: Progressing   Problem: Coping: Goal: Level of anxiety will decrease Outcome: Progressing   Problem: Safety: Goal: Ability to remain free from injury will improve Outcome: Progressing   

## 2020-04-11 NOTE — ED Notes (Signed)
Report called no time to take up

## 2020-04-12 LAB — COMPREHENSIVE METABOLIC PANEL
ALT: 59 U/L — ABNORMAL HIGH (ref 0–44)
AST: 26 U/L (ref 15–41)
Albumin: 3.4 g/dL — ABNORMAL LOW (ref 3.5–5.0)
Alkaline Phosphatase: 41 U/L (ref 38–126)
Anion gap: 6 (ref 5–15)
BUN: 66 mg/dL — ABNORMAL HIGH (ref 8–23)
CO2: 24 mmol/L (ref 22–32)
Calcium: 8.4 mg/dL — ABNORMAL LOW (ref 8.9–10.3)
Chloride: 108 mmol/L (ref 98–111)
Creatinine, Ser: 3.29 mg/dL — ABNORMAL HIGH (ref 0.61–1.24)
GFR calc Af Amer: 19 mL/min — ABNORMAL LOW (ref >60)
GFR calc non Af Amer: 17 mL/min — ABNORMAL LOW (ref >60)
Glucose, Bld: 107 mg/dL — ABNORMAL HIGH (ref 70–99)
Potassium: 4.6 mmol/L (ref 3.5–5.1)
Sodium: 138 mmol/L (ref 135–145)
Total Bilirubin: 0.7 mg/dL (ref 0.3–1.2)
Total Protein: 6 g/dL — ABNORMAL LOW (ref 6.5–8.1)

## 2020-04-12 LAB — CBC WITH DIFFERENTIAL/PLATELET
Abs Immature Granulocytes: 0.11 10*3/uL — ABNORMAL HIGH (ref 0.00–0.07)
Basophils Absolute: 0 10*3/uL (ref 0.0–0.1)
Basophils Relative: 1 %
Eosinophils Absolute: 0.1 10*3/uL (ref 0.0–0.5)
Eosinophils Relative: 2 %
HCT: 40.6 % (ref 39.0–52.0)
Hemoglobin: 13.2 g/dL (ref 13.0–17.0)
Immature Granulocytes: 2 %
Lymphocytes Relative: 32 %
Lymphs Abs: 1.8 10*3/uL (ref 0.7–4.0)
MCH: 28.9 pg (ref 26.0–34.0)
MCHC: 32.5 g/dL (ref 30.0–36.0)
MCV: 89 fL (ref 80.0–100.0)
Monocytes Absolute: 0.6 10*3/uL (ref 0.1–1.0)
Monocytes Relative: 11 %
Neutro Abs: 3 10*3/uL (ref 1.7–7.7)
Neutrophils Relative %: 52 %
Platelets: 169 10*3/uL (ref 150–400)
RBC: 4.56 MIL/uL (ref 4.22–5.81)
RDW: 13.6 % (ref 11.5–15.5)
WBC: 5.7 10*3/uL (ref 4.0–10.5)
nRBC: 0 % (ref 0.0–0.2)

## 2020-04-12 LAB — PHOSPHORUS: Phosphorus: 4.4 mg/dL (ref 2.5–4.6)

## 2020-04-12 LAB — GLUCOSE, CAPILLARY: Glucose-Capillary: 89 mg/dL (ref 70–99)

## 2020-04-12 LAB — MAGNESIUM: Magnesium: 2.2 mg/dL (ref 1.7–2.4)

## 2020-04-12 NOTE — Plan of Care (Signed)
Pt stable at this time. No needs.

## 2020-04-12 NOTE — Progress Notes (Signed)
PT Cancellation Note  Patient Details Name: Ian Rhodes MRN: [id number] DOB: [birth year]   Cancelled Treatment:     PT order received but eval deferred.  Pt seen by OT and screened with no acute OT/PT needs identified.  PT will sign off at this time.   Axton Cihlar 04/12/2020, 10:44 AM

## 2020-04-12 NOTE — Evaluation (Signed)
Occupational Therapy Evaluation Patient Details Name: Ian Rhodes MRN: [id number] DOB: 22-Jan-1936 Today's Date: 04/12/2020    History of Present Illness 84 y.o. male with medical history significant of BPH, carotid artery stenosis, colon polyps, type 2 diabetes, history of DVT following surgery, hyperlipidemia, sleep apnea not on CPAP presenting to the ED for evaluation of abnormal creatinine. Recent diagnoses of COVID ~memorial weekend.   Clinical Impression   Patient ambulatory without any loss of balance, physical assist or AD needed. Able to perform self care tasks in bathroom independently. No acute OT needs at this time, patient is at baseline.     Follow Up Recommendations  No OT follow up    Equipment Recommendations  None recommended by OT       Precautions / Restrictions Precautions Precautions: None Restrictions Weight Bearing Restrictions: No      Mobility Bed Mobility               General bed mobility comments: OOB upon arrival  Transfers Overall transfer level: Independent Equipment used: None                  Balance Overall balance assessment: No apparent balance deficits (not formally assessed)                                         ADL either performed or assessed with clinical judgement   ADL Overall ADL's : Independent                                       General ADL Comments: patient ambulating without assistance, performing grooming/hygiene standing in bathroom                  Pertinent Vitals/Pain Pain Assessment: No/denies pain     Hand Dominance Right   Extremity/Trunk Assessment Upper Extremity Assessment Upper Extremity Assessment: Overall WFL for tasks assessed   Lower Extremity Assessment Lower Extremity Assessment: Overall WFL for tasks assessed   Cervical / Trunk Assessment Cervical / Trunk Assessment: Normal   Communication Communication Communication: No  difficulties   Cognition Arousal/Alertness: Awake/alert Behavior During Therapy: WFL for tasks assessed/performed Overall Cognitive Status: Within Functional Limits for tasks assessed                                                Home Living Family/patient expects to be discharged to:: Private residence Living Arrangements: Spouse/significant other Available Help at Discharge: Family Type of Home: House Home Access: Stairs to enter Technical brewer of Steps: 3 Entrance Stairs-Rails: Right;Left Home Layout: One level     Bathroom Shower/Tub: Occupational psychologist: Handicapped height     Home Equipment: Civil engineer, contracting - built in;Hand held shower head          Prior Functioning/Environment Level of Independence: Independent                 OT Problem List: Decreased activity tolerance         OT Goals(Current goals can be found in the care plan section) Acute Rehab OT Goals Patient Stated Goal: go home and take a shower OT Goal Formulation:  With patient   AM-PAC OT "6 Clicks" Daily Activity     Outcome Measure Help from another person eating meals?: None Help from another person taking care of personal grooming?: None Help from another person toileting, which includes using toliet, bedpan, or urinal?: None Help from another person bathing (including washing, rinsing, drying)?: None Help from another person to put on and taking off regular upper body clothing?: None Help from another person to put on and taking off regular lower body clothing?: None 6 Click Score: 24   End of Session Nurse Communication: Mobility status  Activity Tolerance: Patient tolerated treatment well Patient left: in bed;with call bell/phone within reach  OT Visit Diagnosis: Other abnormalities of gait and mobility (R26.89)                Time: 1010-1022 OT Time Calculation (min): 12 min Charges:  OT General Charges $OT Visit: 1 Visit OT  Evaluation $OT Eval Low Complexity: [place] OT Pager: [place] 04/12/2020, 10:38 AM

## 2020-04-12 NOTE — Progress Notes (Signed)
Rn called patient at home to verify he knew what labs needed to be drawn by primary care md next week. He stated he would call his primary md in the morning and arrange for these labs to be drawn.

## 2020-04-12 NOTE — Discharge Summary (Signed)
Physician Discharge Summary  Ian Rhodes ELF:[id number] DOB: 06/05/1936 DOA: 04/10/2020  PCP: London Pepper, MD  Admit date: 04/10/2020 Discharge date: 04/12/2020  Admitted From: Home Disposition: Home  Recommendations for Outpatient Follow-up:  1. Follow up with PCP in 1-2 weeks 2. Please obtain CMP/CBC, Mag, Phos in one week 3. Continue to hold lisinopril and will discontinue glipizide for now 4. Please follow up on the following pending results:  Home Health: No Equipment/Devices: None    Discharge Condition: Stable  CODE STATUS: FULL CODE Diet recommendation: Heart Healthy Diet  Brief/Interim Summary: HPI per Dr. Shela Leff on 04/10/20 Ian Alamin IIIis a 84 y.o.malewith medical history significant ofBPH, carotid artery stenosis, colon polyps, type 2 diabetes, history of DVT following surgery, hyperlipidemia, sleep apnea not on CPAP presenting to the ED for evaluation of abnormal creatinine.Patient states he had Covid symptoms such as weakness, cough, and poor oral intake starting memorial day. About 2 to 3 days later he went to get tested and was positive. States he has now recovered from his infection. His oxygen saturation never dropped and he was not hospitalized for Covid. He went to see his PCP to get routine labs today and was told his creatinine was 4.8. States he has stage IV kidney disease from diabetes and his creatinine is normally around 2.4. His nephrologist is Dr. Jimmy Footman. No other complaints.Denies shortness of breath, chest pain, nausea, vomiting, or diarrhea.  ED Course:Afebrile. Labs showing no leukocytosis. Bicarb 20, anion gap 16. Blood glucose 95. BUN 75, creatinine 3.8. No recent labs for comparison. Creatinine was 1.1 in 2012.  **Interim History  Patient's renal function started trending down and he improved with hydration.  Tolerating his diet without issues and ambulated with physical therapy with no needs.  He is stable for  discharge at this time will need to repeat his CBC and CMP along with follow-up with his PCP and nephrologist in 1 to 2 weeks.  Patient is understandable agreeable with the plan of care and he feels much better.  He is stable to be discharged at this time.  Discharge Diagnoses:  Principal Problem:   AKI (acute kidney injury) ([facility]) Active Problems:   Metabolic acidosis   History of COVID-19   Type 2 diabetes mellitus (HCC)   Hyperlipidemia  AKIon CKD stage IV, improving -Likely prerenal due to dehydration in setting of recent Covid infection and home ACE inhibitor use.  -BUN 75, creatinine 3.85 on admission. 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 -Urinalysis showed a clear appearance with negative bilirubin, yellow color urine, negative glucose, negative hemoglobin, 5 ketones, negative leukocytes, negative nitrites -C/w IV fluid hydration and he received a 1 L bolus of lactated Ringer's and was given on 25 mils per hour of fluid for 12 hours and this is expired and I have renewed this for at 75 MLS per hour and will continue this at discharge as patient is adequately hydrating himself -Continue to Monitor renal function and urine output.  -Check urine sodium and creatinine and urine sodium was 34, urine creatinine was 133.48.  -Renal ultrasound done and showed "No hydronephrosis or obstructive uropathy. Bilateral renal parenchymal thinning with increased renalechogenicity consistent with chronic medical renal disease. Bilateral renal cysts."  -Continue Hold ACE inhibitor at discharge -Continue oral hydration and follow-up with PCP -Avoid Nephrotoxic medications, contrast  dyes, hypotension and renally dose medications -Consult nephrology if no improvement in renal function with IV fluid hydration. -We  will need to follow-up with his nephrologist as well as PCP and repeat CBC, CMP, mag and Foss within 1 week.  Mild High Anion Gap Metabolic Acidosis -Likely due to AKI. Bicarb 20, anion gap 16; it is now improved and CO2 is 24, anion gap is 6, chloride levels 108 -Continue IV fluid hydration and repeat CMP in a.m.  Recent Covid Infection -Patient reportshistory of Covid symptoms which started approximately 18 days ago.  -He has now recovered from his infection and feeling better.  -It seems his infection is mild as he was never hospitalized.  He has been vaccinated for Covid -Currently nottachypneic or hypoxic. Chest x-ray not suggestive of pneumonia. -Will not retest for Covid as the test can be positive for up to 3 months from the date of infection.  -Since he is currently asymptomatic andwellpast the 10-day isolationperiod,isolation precautions no longer needed. -Follow-up in outpatient setting is no longer symptomatic  Noninsulin-dependent type 2 diabetes -Check A1c but never done this a.m. so we will need to have it done in outpatient setting -C/w Sliding scale insulin sensitive ACHSand CBG checks. -CBG's ranging from 81- 103  -We will discontinue his home glipizide for now and happy PCP to further evaluate and manage  Hyperlipidemia -Continue home Pravastatin 80 mg po qHS  OSA -Not on CPAP  Hx of Carotid Stenosis -C/w ASA 81 mg po Daily and Pravastatin 80 mg po qHS  Obesity -Estimated body mass index is 30.5 kg/m as calculated from the following:   Height as of this encounter: 5\' 11"  (1.803 m).   Weight as of this encounter: 99.2 kg. -Continued Weight Loss and Dietary Counseling  Elevated ALT -Mild with ALT of 59 -Continue monitor and trend in the outpatient setting with PCP follow-up and if continues to be elevated will need further work-up with a right upper quadrant ultrasound and acute hepatitis panel  Discharge Instructions  Discharge Instructions     Call MD for:  difficulty breathing, headache or visual disturbances   Complete by: As directed    Call MD for:  extreme fatigue   Complete by: As directed    Call MD for:  hives   Complete by: As directed    Call MD for:  persistant dizziness or light-headedness   Complete by: As directed    Call MD for:  persistant nausea and vomiting   Complete by: As directed    Call MD for:  redness, tenderness, or signs of infection (pain, swelling, redness, odor or green/yellow discharge around incision site)   Complete by: As directed    Call MD for:  severe uncontrolled pain   Complete by: As directed    Call MD for:  temperature >100.4   Complete by: As directed    Diet - low sodium heart healthy   Complete by: As directed    Discharge instructions   Complete by: As directed    You were cared for by a hospitalist during your hospital stay. If you have any questions about your discharge medications or the care you received while you were in the hospital after you are discharged, you can call the unit and ask to speak with the hospitalist on call if the hospitalist that took care of you is not available. Once you are discharged, your primary care physician will handle any further medical issues. Please note that NO REFILLS for  any discharge medications will be authorized once you are discharged, as it is imperative that you return to your primary care physician (or establish a relationship with a primary care physician if you do not have one) for your aftercare needs so that they can reassess your need for medications and monitor your lab values.  Follow up with PCP and Nephrology within 1-2 weeks. Repeat Lab work with CBC, CMP, Mag, and Phos within 1 week. Take all medications as prescribed. If symptoms change or worsen please return to the ED for evaluation   Increase activity slowly   Complete by: As directed      Allergies as of 04/12/2020   No Known Allergies     Medication List    STOP  taking these medications   glipiZIDE 5 MG 24 hr tablet Commonly known as: GLUCOTROL XL   lisinopril 5 MG tablet Commonly known as: ZESTRIL     TAKE these medications   acetaminophen 500 MG tablet Commonly known as: TYLENOL Take 500 mg by mouth every 6 (six) hours as needed for moderate pain.   aspirin 81 MG tablet Take 81 mg by mouth every evening.   calcitRIOL 0.25 MCG capsule Commonly known as: ROCALTROL Take 0.25 mcg by mouth daily.   cholecalciferol 25 MCG (1000 UNIT) tablet Commonly known as: VITAMIN D3 Take 1,000 Units by mouth daily.   ICAPS Tabs Take 1 tablet by mouth daily.   potassium citrate 10 MEQ (1080 MG) SR tablet Commonly known as: UROCIT-K Take 20 mEq by mouth daily.   pravastatin 80 MG tablet Commonly known as: PRAVACHOL Take 80 mg by mouth daily. What changed: Another medication with the same name was removed. Continue taking this medication, and follow the directions you see here.   vitamin B-12 100 MCG tablet Commonly known as: CYANOCOBALAMIN Take 100 mcg by mouth daily.   vitamin C 250 MG tablet Commonly known as: ASCORBIC ACID Take 250 mg by mouth daily.   ZINC PO Take 1 tablet by mouth daily.       No Known Allergies  Consultations:  None  Procedures/Studies: US RENAL  Result Date: 04/10/2020 CLINICAL DATA:  Acute kidney injury. EXAM: RENAL / URINARY TRACT ULTRASOUND COMPLETE COMPARISON:  Renal ultrasound 09/07/2015 FINDINGS: Right Kidney: Renal measurements: 13.0 x 6.1 x 6.3 cm = volume: 262 mL. Diffusely increased renal parenchymal echogenicity with mild thinning of the renal parenchyma. Renal cysts, largest measuring 2.3 and 2.2 cm. No evidence of solid lesion. No hydronephrosis. Left Kidney: Renal measurements: 11.1 x 5.1 x 4.4 cm = volume: 132 mL. Diffusely increased renal parenchymal echogenicity and renal cortical thinning. Renal cysts, largest measuring 4.8 cm. This cyst has a thin internal septation. No evidence of solid or  suspicious lesion. No hydronephrosis. Previous stone is not well visualized on the current exam. Bladder: Appears normal for degree of bladder distention. Other: None. IMPRESSION: 1. No hydronephrosis or obstructive uropathy. 2. Bilateral renal parenchymal thinning with increased renal echogenicity consistent with chronic medical renal disease. 3. Bilateral renal cysts. Electronically Signed   By: Keith Rake M.D.   On: 04/10/2020 23:23   DG Chest Port 1 View  Result Date: 04/10/2020 CLINICAL DATA:  Cough. EXAM: PORTABLE CHEST 1 VIEW COMPARISON:  09/14/2011 FINDINGS: Heart is normal in size.The cardiomediastinal contours are normal. The lungs are clear. Pulmonary vasculature is normal. No consolidation, pleural effusion, or pneumothorax. No acute osseous abnormalities are seen. Thoracic spondylosis. IMPRESSION: No acute chest findings. Electronically Signed   By: Threasa Beards  [place] M.D.   On: 04/10/2020 21:48     Subjective: Seen and examined at bedside he is doing better and denies any complaints.  No nausea or vomiting.  Had some foot pain on the right foot but this is improved.  No other concerns or complaints at this time.  Discharge Exam: Vitals:   04/11/20 2130 04/12/20 0500  BP: 113/62 121/66  Pulse: 77 71  Resp: 15 13  Temp: 97.9 F (36.6 C) 97.9 F (36.6 C)  SpO2: 98% 97%   Vitals:   04/11/20 0940 04/11/20 1329 04/11/20 2130 04/12/20 0500  BP: 115/72 122/73 113/62 121/66  Pulse: 85 90 77 71  Resp: 16 16 15 13   Temp: 97.8 F (36.6 C) 98 F (36.7 C) 97.9 F (36.6 C) 97.9 F (36.6 C)  TempSrc: Oral     SpO2: 97% 96% 98% 97%  Weight:      Height:       General: Pt is alert, awake, not in acute distress Cardiovascular: RRR, S1/S2 +, no rubs, no gallops Respiratory: Slightly diminished bilaterally, no wheezing, no rhonchi; unlabored breathing Abdominal: Soft, NT, distended secondary body, bowel sounds + Extremities: no edema, no cyanosis  The results of significant  diagnostics from this hospitalization (including imaging, microbiology, ancillary and laboratory) are listed below for reference.    Microbiology: No results found for this or any previous visit (from the past 240 hour(s)).   Labs: BNP (last 3 results) No results for input(s): BNP in the last 8760 hours. Basic Metabolic Panel: Recent Labs  Lab 04/10/20 1940 04/11/20 0324 04/12/20 0314  NA 140 136 138  K 4.7 4.6 4.6  CL 104 103 108  CO2 20* 24 24  GLUCOSE 95 70 107*  BUN 75* 75* 66*  CREATININE 3.85* 3.73* 3.29*  CALCIUM 8.7* 8.1* 8.4*  MG  --   --  2.2  PHOS  --   --  4.4   Liver Function Tests: Recent Labs  Lab 04/12/20 0314  AST 26  ALT 59*  ALKPHOS 41  BILITOT 0.7  PROT 6.0*  ALBUMIN 3.4*   No results for input(s): LIPASE, AMYLASE in the last 168 hours. No results for input(s): AMMONIA in the last 168 hours. CBC: Recent Labs  Lab 04/10/20 1940 04/12/20 0314  WBC 7.5 5.7  NEUTROABS  --  3.0  HGB 14.3 13.2  HCT 43.5 40.6  MCV 88.6 89.0  PLT 200 169   Cardiac Enzymes: No results for input(s): CKTOTAL, CKMB, CKMBINDEX, TROPONINI in the last 168 hours. BNP: Invalid input(s): POCBNP CBG: Recent Labs  Lab 04/10/20 2333 04/11/20 0715 04/11/20 1140 04/11/20 1644 04/11/20 2131  GLUCAP 83 81 85 101* 103*   D-Dimer No results for input(s): DDIMER in the last 72 hours. Hgb A1c No results for input(s): HGBA1C in the last 72 hours. Lipid Profile No results for input(s): CHOL, HDL, LDLCALC, TRIG, CHOLHDL, LDLDIRECT in the last 72 hours. Thyroid function studies No results for input(s): TSH, T4TOTAL, T3FREE, THYROIDAB in the last 72 hours.  Invalid input(s): FREET3 Anemia work up No results for input(s): VITAMINB12, FOLATE, FERRITIN, TIBC, IRON, RETICCTPCT in the last 72 hours. Urinalysis    Component Value Date/Time   COLORURINE YELLOW 04/10/2020 2103   APPEARANCEUR CLEAR 04/10/2020 2103   LABSPEC 1.013 04/10/2020 2103   PHURINE 5.0 04/10/2020 2103    GLUCOSEU NEGATIVE 04/10/2020 2103   HGBUR NEGATIVE 04/10/2020 2103   BILIRUBINUR NEGATIVE 04/10/2020 2103   KETONESUR 5 (A) 04/10/2020 2103  PROTEINUR NEGATIVE 04/10/2020 2103   UROBILINOGEN 0.2 05/10/2010 1559   NITRITE NEGATIVE 04/10/2020 2103   LEUKOCYTESUR NEGATIVE 04/10/2020 2103   Sepsis Labs Invalid input(s): PROCALCITONIN,  WBC,  LACTICIDVEN Microbiology No results found for this or any previous visit (from the past 240 hour(s)).  Time coordinating discharge: 35 minutes  SIGNED:  Kerney Elbe, DO [facility] 04/12/2020, 11:26 AM Pager is on [place]  If 7PM-7AM, please contact night-coverage [email or web address]

## 2020-04-12 NOTE — Plan of Care (Signed)
  Problem: Clinical Measurements: Goal: Respiratory complications will improve Outcome: Progressing   Problem: Clinical Measurements: Goal: Cardiovascular complication will be avoided Outcome: Progressing   Problem: Coping: Goal: Level of anxiety will decrease Outcome: Progressing   Problem: Pain Managment: Goal: General experience of comfort will improve Outcome: Progressing   Problem: Safety: Goal: Ability to remain free from injury will improve Outcome: Progressing   Problem: Skin Integrity: Goal: Risk for impaired skin integrity will decrease Outcome: Progressing   

## 2020-04-12 NOTE — Discharge Instructions (Signed)
Acute Kidney Injury, Adult  Acute kidney injury is a sudden worsening of kidney function. The kidneys are organs that have several jobs. They filter the blood to remove waste products and extra fluid. They also maintain a healthy balance of minerals and hormones in the body, which helps control blood pressure and keep bones strong. With this condition, your kidneys do not do their jobs as well as they should. This condition ranges from mild to severe. Over time it may develop into long-lasting (chronic) kidney disease. Early detection and treatment may prevent acute kidney injury from developing into a chronic condition. What are the causes? Common causes of this condition include:  A problem with blood flow to the kidneys. This may be caused by: ? Low blood pressure (hypotension) or shock. ? Blood loss. ? Heart and blood vessel (cardiovascular) disease. ? Severe burns. ? Liver disease.  Direct damage to the kidneys. This may be caused by: ? Certain medicines. ? A kidney infection. ? Poisoning. ? Being around or in contact with toxic substances. ? A surgical wound. ? A hard, direct hit to the kidney area.  A sudden blockage of urine flow. This may be caused by: ? Cancer. ? Kidney stones. ? An enlarged prostate in males. What are the signs or symptoms? Symptoms of this condition may not be obvious until the condition becomes severe. Symptoms of this condition can include:  Tiredness (lethargy), or difficulty staying awake.  Nausea or vomiting.  Swelling (edema) of the face, legs, ankles, or feet.  Problems with urination, such as: ? Abdominal pain, or pain along the side of your stomach (flank). ? Decreased urine production. ? Decrease in the force of urine flow.  Muscle twitches and cramps, especially in the legs.  Confusion or trouble concentrating.  Loss of appetite.  Fever. How is this diagnosed? This condition may be diagnosed with tests, including:  Blood  tests.  Urine tests.  Imaging tests.  A test in which a sample of tissue is removed from the kidneys to be examined under a microscope (kidney biopsy). How is this treated? Treatment for this condition depends on the cause and how severe the condition is. In mild cases, treatment may not be needed. The kidneys may heal on their own. In more severe cases, treatment will involve:  Treating the cause of the kidney injury. This may involve changing any medicines you are taking or adjusting your dosage.  Fluids. You may need specialized IV fluids to balance your body's needs.  Having a catheter placed to drain urine and prevent blockages.  Preventing problems from occurring. This may mean avoiding certain medicines or procedures that can cause further injury to the kidneys. In some cases treatment may also require:  A procedure to remove toxic wastes from the body (dialysis or continuous renal replacement therapy - CRRT).  Surgery. This may be done to repair a torn kidney, or to remove the blockage from the urinary system. Follow these instructions at home: Medicines  Take over-the-counter and prescription medicines only as told by your health care provider.  Do not take any new medicines without your health care provider's approval. Many medicines can worsen your kidney damage.  Do not take any vitamin and mineral supplements without your health care provider's approval. Many nutritional supplements can worsen your kidney damage. Lifestyle  If your health care provider prescribed changes to your diet, follow them. You may need to decrease the amount of protein you eat.  Achieve and maintain a healthy   weight. If you need help with this, ask your health care provider.  Start or continue an exercise plan. Try to exercise at least 30 minutes a day, 5 days a week.  Do not use any tobacco products, such as cigarettes, chewing tobacco, and e-cigarettes. If you need help quitting, ask your  health care provider. General instructions  Keep track of your blood pressure. Report changes in your blood pressure as told by your health care provider.  Stay up to date with immunizations. Ask your health care provider which immunizations you need.  Keep all follow-up visits as told by your health care provider. This is important. Where to find more information  American Association of Kidney Patients: BombTimer.gl  National Kidney Foundation: www.kidney.[place]: https://mathis.com/  [facility]: ? www.lifeoptions.org ? www.kidneyschool.org Contact a health care provider if:  Your symptoms get worse.  You develop new symptoms. Get help right away if:  You develop symptoms of worsening kidney disease, which include: ? Headaches. ? Abnormally dark or light skin. ? Easy bruising. ? Frequent hiccups. ? Chest pain. ? Shortness of breath. ? End of menstruation in women. ? Seizures. ? Confusion or altered mental status. ? Abdominal or back pain. ? Itchiness.  You have a fever.  Your body is producing less urine.  You have pain or bleeding when you urinate. Summary  Acute kidney injury is a sudden worsening of kidney function.  Acute kidney injury can be caused by problems with blood flow to the kidneys, direct damage to the kidneys, and sudden blockage of urine flow.  Symptoms of this condition may not be obvious until it becomes severe. Symptoms may include edema, lethargy, confusion, nausea or vomiting, and problems passing urine.  This condition can usually be diagnosed with blood tests, urine tests, and imaging tests. Sometimes a kidney biopsy is done to diagnose this condition.  Treatment for this condition often involves treating the underlying cause. It is treated with fluids, medicines, dialysis, diet changes, or surgery. This information is not intended to replace advice given to you by your health care provider. Make  sure you discuss any questions you have with your health care provider. Document Revised: 09/22/2017 Document Reviewed: 09/30/2016 Elsevier Patient Education  [employer].   Dehydration, Adult Dehydration is condition in which there is not enough water or other fluids in the body. This happens when a person loses more fluids than he or she takes in. Important body parts cannot work right without the right amount of fluids. Any loss of fluids from the body can cause dehydration. Dehydration can be mild, worse, or very bad. It should be treated right away to keep it from getting very bad. What are the causes? This condition may be caused by:  Conditions that cause loss of water or other fluids, such as: ? Watery poop (diarrhea). ? Vomiting. ? Sweating a lot. ? Peeing (urinating) a lot.  Not drinking enough fluids, especially when you: ? Are ill. ? Are doing things that take a lot of energy to do.  Other illnesses and conditions, such as fever or infection.  Certain medicines, such as medicines that take extra fluid out of the body (diuretics).  Lack of safe drinking water.  Not being able to get enough water and food. What increases the risk? The following factors may make you more likely to develop this condition:  Having a long-term (chronic) illness that has not been treated the right way, such as: ?  Diabetes. ? Heart disease. ? Kidney disease.  Being 84 years of age or older.  Having a disability.  Living in a place that is high above the ground or sea (high in altitude). The thinner, dried air causes more fluid loss.  Doing exercises that put stress on your body for a long time. What are the signs or symptoms? Symptoms of dehydration depend on how bad it is. Mild or worse dehydration  Thirst.  Dry lips or dry mouth.  Feeling dizzy or light-headed, especially when you stand up from sitting.  Muscle cramps.  Your body making: ? Dark pee (urine). Pee may  be the color of tea. ? Less pee than normal. ? Less tears than normal.  Headache. Very bad dehydration  Changes in skin. Skin may: ? Be cold to the touch (clammy). ? Be blotchy or pale. ? Not go back to normal right after you lightly pinch it and let it go.  Little or no tears, pee, or sweat.  Changes in vital signs, such as: ? Fast breathing. ? Low blood pressure. ? Weak pulse. ? Pulse that is more than 100 beats a minute when you are sitting still.  Other changes, such as: ? Feeling very thirsty. ? Eyes that look hollow (sunken). ? Cold hands and feet. ? Being mixed up (confused). ? Being very tired (lethargic) or having trouble waking from sleep. ? Short-term weight loss. ? Loss of consciousness. How is this treated? Treatment for this condition depends on how bad it is. Treatment should start right away. Do not wait until your condition gets very bad. Very bad dehydration is an emergency. You will need to go to a hospital.  Mild or worse dehydration can be treated at home. You may be asked to: ? Drink more fluids. ? Drink an oral rehydration solution (ORS). This drink helps get the right amounts of fluids and salts and minerals in the blood (electrolytes).  Very bad dehydration can be treated: ? With fluids through an IV tube. ? By getting normal levels of salts and minerals in your blood. This is often done by giving salts and minerals through a tube. The tube is passed through your nose and into your stomach. ? By treating the root cause. Follow these instructions at home: Oral rehydration solution If told by your doctor, drink an ORS:  Make an ORS. Use instructions on the package.  Start by drinking small amounts, about  cup (120 mL) every 5-10 minutes.  Slowly drink more until you have had the amount that your doctor said to have. Eating and drinking         Drink enough clear fluid to keep your pee pale yellow. If you were told to drink an ORS, finish  the ORS first. Then, start slowly drinking other clear fluids. Drink fluids such as: ? Water. Do not drink only water. Doing that can make the salt (sodium) level in your body get too low. ? Water from ice chips you suck on. ? Fruit juice that you have added water to (diluted). ? Low-calorie sports drinks.  Eat foods that have the right amounts of salts and minerals, such as: ? Bananas. ? Oranges. ? Potatoes. ? Tomatoes. ? Spinach.  Do not drink alcohol.  Avoid: ? Drinks that have a lot of sugar. These include:  High-calorie sports drinks.  Fruit juice that you did not add water to.  Soda.  Caffeine. ? Foods that are greasy or have a lot of fat  or sugar. General instructions  Take over-the-counter and prescription medicines only as told by your doctor.  Do not take salt tablets. Doing that can make the salt level in your body get too high.  Return to your normal activities as told by your doctor. Ask your doctor what activities are safe for you.  Keep all follow-up visits as told by your doctor. This is important. Contact a doctor if:  You have pain in your belly (abdomen) and the pain: ? Gets worse. ? Stays in one place.  You have a rash.  You have a stiff neck.  You get angry or annoyed (irritable) more easily than normal.  You are more tired or have a harder time waking than normal.  You feel: ? Weak or dizzy. ? Very thirsty. Get help right away if you have:  Any symptoms of very bad dehydration.  Symptoms of vomiting, such as: ? You cannot eat or drink without vomiting. ? Your vomiting gets worse or does not go away. ? Your vomit has blood or green stuff in it.  Symptoms that get worse with treatment.  A fever.  A very bad headache.  Problems with peeing or pooping (having a bowel movement), such as: ? Watery poop that gets worse or does not go away. ? Blood in your poop (stool). This may cause poop to look black and tarry. ? Not peeing in 6-8  hours. ? Peeing only a small amount of very dark pee in 6-8 hours.  Trouble breathing. These symptoms may be an emergency. Do not wait to see if the symptoms will go away. Get medical help right away. Call your local emergency services (911 in the [place] Do not drive yourself to the hospital. Summary  Dehydration is a condition in which there is not enough water or other fluids in the body. This happens when a person loses more fluids than he or she takes in.  Treatment for this condition depends on how bad it is. Treatment should be started right away. Do not wait until your condition gets very bad.  Drink enough clear fluid to keep your pee pale yellow. If you were told to drink an oral rehydration solution (ORS), finish the ORS first. Then, start slowly drinking other clear fluids.  Take over-the-counter and prescription medicines only as told by your doctor.  Get help right away if you have any symptoms of very bad dehydration. This information is not intended to replace advice given to you by your health care provider. Make sure you discuss any questions you have with your health care provider. Document Revised: 05/23/2019 Document Reviewed: 05/23/2019 Elsevier Patient Education  [employer].

## 2020-04-12 NOTE — Progress Notes (Signed)
Pt stable at time of d/c instructions and education. Pt  Had no needs. Rn will continue to monitor.

## 2020-04-12 NOTE — Plan of Care (Signed)

## 2020-04-13 LAB — HEMOGLOBIN A1C
Hgb A1c MFr Bld: 6.3 % — ABNORMAL HIGH (ref 4.8–5.6)
Mean Plasma Glucose: 134 mg/dL

## 2020-04-14 ENCOUNTER — Encounter (INDEPENDENT_AMBULATORY_CARE_PROVIDER_SITE_OTHER): Payer: PPO | Admitting: Ophthalmology

## 2020-04-14 DIAGNOSIS — N179 Acute kidney failure, unspecified: Secondary | ICD-10-CM | POA: Diagnosis not present

## 2020-04-14 DIAGNOSIS — E1122 Type 2 diabetes mellitus with diabetic chronic kidney disease: Secondary | ICD-10-CM | POA: Diagnosis not present

## 2020-04-14 DIAGNOSIS — Z09 Encounter for follow-up examination after completed treatment for conditions other than malignant neoplasm: Secondary | ICD-10-CM | POA: Diagnosis not present

## 2020-04-14 DIAGNOSIS — Z8619 Personal history of other infectious and parasitic diseases: Secondary | ICD-10-CM | POA: Diagnosis not present

## 2020-05-05 ENCOUNTER — Other Ambulatory Visit: Payer: Self-pay

## 2020-05-05 ENCOUNTER — Encounter (INDEPENDENT_AMBULATORY_CARE_PROVIDER_SITE_OTHER): Payer: PPO | Admitting: Ophthalmology

## 2020-05-05 DIAGNOSIS — H43813 Vitreous degeneration, bilateral: Secondary | ICD-10-CM | POA: Diagnosis not present

## 2020-05-05 DIAGNOSIS — H353112 Nonexudative age-related macular degeneration, right eye, intermediate dry stage: Secondary | ICD-10-CM | POA: Diagnosis not present

## 2020-05-05 DIAGNOSIS — H35342 Macular cyst, hole, or pseudohole, left eye: Secondary | ICD-10-CM | POA: Diagnosis not present

## 2020-05-22 DIAGNOSIS — E1141 Type 2 diabetes mellitus with diabetic mononeuropathy: Secondary | ICD-10-CM | POA: Diagnosis not present

## 2020-05-27 DIAGNOSIS — N2581 Secondary hyperparathyroidism of renal origin: Secondary | ICD-10-CM | POA: Diagnosis not present

## 2020-05-27 DIAGNOSIS — E785 Hyperlipidemia, unspecified: Secondary | ICD-10-CM | POA: Diagnosis not present

## 2020-05-27 DIAGNOSIS — I129 Hypertensive chronic kidney disease with stage 1 through stage 4 chronic kidney disease, or unspecified chronic kidney disease: Secondary | ICD-10-CM | POA: Diagnosis not present

## 2020-05-27 DIAGNOSIS — N179 Acute kidney failure, unspecified: Secondary | ICD-10-CM | POA: Diagnosis not present

## 2020-05-27 DIAGNOSIS — G4730 Sleep apnea, unspecified: Secondary | ICD-10-CM | POA: Diagnosis not present

## 2020-05-27 DIAGNOSIS — N400 Enlarged prostate without lower urinary tract symptoms: Secondary | ICD-10-CM | POA: Diagnosis not present

## 2020-05-27 DIAGNOSIS — I6529 Occlusion and stenosis of unspecified carotid artery: Secondary | ICD-10-CM | POA: Diagnosis not present

## 2020-05-27 DIAGNOSIS — N184 Chronic kidney disease, stage 4 (severe): Secondary | ICD-10-CM | POA: Diagnosis not present

## 2020-05-27 DIAGNOSIS — Z8616 Personal history of COVID-19: Secondary | ICD-10-CM | POA: Diagnosis not present

## 2020-05-27 DIAGNOSIS — N189 Chronic kidney disease, unspecified: Secondary | ICD-10-CM | POA: Diagnosis not present

## 2020-05-27 DIAGNOSIS — D631 Anemia in chronic kidney disease: Secondary | ICD-10-CM | POA: Diagnosis not present

## 2020-05-27 DIAGNOSIS — N200 Calculus of kidney: Secondary | ICD-10-CM | POA: Diagnosis not present

## 2020-05-27 DIAGNOSIS — E1122 Type 2 diabetes mellitus with diabetic chronic kidney disease: Secondary | ICD-10-CM | POA: Diagnosis not present

## 2020-07-13 ENCOUNTER — Telehealth: Payer: Self-pay | Admitting: Orthopedic Surgery

## 2020-07-13 NOTE — Telephone Encounter (Signed)
I called pt and advised that he has not been in the office since 2017 and would like to see him in the office but Dr. Sharol Given does not do shoulder replacement. I advised that Dr. Marlou Sa who is a partner in the office does do these surgeries and the pt said that he was going to go off the recommendation of a friend who had the surgery and that if he had any other orthopedic problems he would call the office

## 2020-07-13 NOTE — Telephone Encounter (Signed)
Pt states last time he saw Dr.Duda he mentioned having to get his shoulder replaced and so the pt would like to know if Dr.Duda replaces shoulders and if so he would like a CB from [place]

## 2020-07-15 DIAGNOSIS — Z8619 Personal history of other infectious and parasitic diseases: Secondary | ICD-10-CM | POA: Diagnosis not present

## 2020-07-15 DIAGNOSIS — E7800 Pure hypercholesterolemia, unspecified: Secondary | ICD-10-CM | POA: Diagnosis not present

## 2020-07-15 DIAGNOSIS — Z23 Encounter for immunization: Secondary | ICD-10-CM | POA: Diagnosis not present

## 2020-07-15 DIAGNOSIS — N184 Chronic kidney disease, stage 4 (severe): Secondary | ICD-10-CM | POA: Diagnosis not present

## 2020-07-15 DIAGNOSIS — E1122 Type 2 diabetes mellitus with diabetic chronic kidney disease: Secondary | ICD-10-CM | POA: Diagnosis not present

## 2020-07-17 ENCOUNTER — Other Ambulatory Visit: Payer: Self-pay | Admitting: Orthopedic Surgery

## 2020-07-17 DIAGNOSIS — M12812 Other specific arthropathies, not elsewhere classified, left shoulder: Secondary | ICD-10-CM

## 2020-07-17 DIAGNOSIS — S46012A Strain of muscle(s) and tendon(s) of the rotator cuff of left shoulder, initial encounter: Secondary | ICD-10-CM | POA: Diagnosis not present

## 2020-07-29 ENCOUNTER — Other Ambulatory Visit: Payer: Self-pay

## 2020-07-29 ENCOUNTER — Ambulatory Visit
Admission: RE | Admit: 2020-07-29 | Discharge: 2020-07-29 | Disposition: A | Discharge status: Home / Self Care | Payer: PPO | Source: Ambulatory Visit | Attending: Orthopedic Surgery | Admitting: Orthopedic Surgery

## 2020-07-29 DIAGNOSIS — M19012 Primary osteoarthritis, left shoulder: Secondary | ICD-10-CM | POA: Diagnosis not present

## 2020-07-29 DIAGNOSIS — Z01818 Encounter for other preprocedural examination: Secondary | ICD-10-CM | POA: Diagnosis not present

## 2020-07-29 DIAGNOSIS — M12812 Other specific arthropathies, not elsewhere classified, left shoulder: Secondary | ICD-10-CM

## 2020-08-12 DIAGNOSIS — M25512 Pain in left shoulder: Secondary | ICD-10-CM | POA: Diagnosis not present

## 2020-08-12 DIAGNOSIS — E1122 Type 2 diabetes mellitus with diabetic chronic kidney disease: Secondary | ICD-10-CM | POA: Diagnosis not present

## 2020-08-12 DIAGNOSIS — N184 Chronic kidney disease, stage 4 (severe): Secondary | ICD-10-CM | POA: Diagnosis not present

## 2020-08-12 DIAGNOSIS — Z01818 Encounter for other preprocedural examination: Secondary | ICD-10-CM | POA: Diagnosis not present

## 2020-08-17 DIAGNOSIS — M19012 Primary osteoarthritis, left shoulder: Secondary | ICD-10-CM | POA: Diagnosis not present

## 2020-08-17 DIAGNOSIS — Z01812 Encounter for preprocedural laboratory examination: Secondary | ICD-10-CM | POA: Diagnosis not present

## 2020-08-17 DIAGNOSIS — M25512 Pain in left shoulder: Secondary | ICD-10-CM | POA: Diagnosis not present

## 2020-08-19 NOTE — Patient Instructions (Addendum)
DUE TO COVID-19 ONLY ONE VISITOR IS ALLOWED TO COME WITH YOU AND STAY IN THE WAITING ROOM ONLY DURING PRE OP AND PROCEDURE DAY OF SURGERY. THE 1 VISITOR  MAY VISIT WITH YOU AFTER SURGERY IN YOUR PRIVATE ROOM DURING VISITING HOURS ONLY!  YOU NEED TO HAVE A COVID 19 TEST ON: 08/28/20 @ 10:00 AM , THIS TEST MUST BE DONE BEFORE SURGERY,  COVID TESTING SITE [place] [id number], IT IS ON THE RIGHT GOING OUT [address] APPROXIMATELY  2 MINUTES PAST [employer] ON THE RIGHT. ONCE YOUR COVID TEST IS COMPLETED,  PLEASE BEGIN THE QUARANTINE INSTRUCTIONS AS OUTLINED IN YOUR HANDOUT.                Ian Rhodes    Your procedure is scheduled on: 09/01/20   Report to [facility] Main  Entrance   Report to short stay at: 5:30 AM     Call this number if you have problems the morning of surgery [phone]    Remember:   NO SOLID FOOD AFTER MIDNIGHT THE NIGHT PRIOR TO SURGERY. NOTHING BY MOUTH EXCEPT CLEAR LIQUIDS UNTIL: 4:30 AM . PLEASE FINISH GATORADE DRINK PER SURGEON ORDER  WHICH NEEDS TO BE COMPLETED AT: 4:30 AM .  CLEAR LIQUID DIET   Foods Allowed                                                                     Foods Excluded  Coffee and tea, regular and decaf                             liquids that you cannot  Plain Jell-O any favor except red or purple                                           see through such as: Fruit ices (not with fruit pulp)                                     milk, soups, orange juice  Iced Popsicles                                    All solid food Carbonated beverages, regular and diet                                    Cranberry, grape and apple juices Sports drinks like Gatorade Lightly seasoned clear broth or consume(fat free) Sugar, honey syrup  Sample Menu Breakfast                                Lunch  Supper Cranberry juice                    Beef broth                             Chicken broth Jell-O                                     Grape juice                           Apple juice Coffee or tea                        Jell-O                                      Popsicle                                                Coffee or tea                        Coffee or tea  _____________________________________________________________________  BRUSH YOUR TEETH MORNING OF SURGERY AND RINSE YOUR MOUTH OUT, NO CHEWING GUM CANDY OR MINTS.   How to Manage Your Diabetes Before and After Surgery  Why is it important to control my blood sugar before and after surgery? . Improving blood sugar levels before and after surgery helps healing and can limit problems. . A way of improving blood sugar control is eating a healthy diet by: o  Eating less sugar and carbohydrates o  Increasing activity/exercise o  Talking with your doctor about reaching your blood sugar goals . High blood sugars (greater than 180 mg/dL) can raise your risk of infections and slow your recovery, so you will need to focus on controlling your diabetes during the weeks before surgery. . Make sure that the doctor who takes care of your diabetes knows about your planned surgery including the date and location.  How do I manage my blood sugar before surgery? . Check your blood sugar at least 4 times a day, starting 2 days before surgery, to make sure that the level is not too high or low. o Check your blood sugar the morning of your surgery when you wake up and every 2 hours until you get to the Short Stay unit. . If your blood sugar is less than 70 mg/dL, you will need to treat for low blood sugar: o Do not take insulin. o Treat a low blood sugar (less than 70 mg/dL) with  cup of clear juice (cranberry or apple), 4 glucose tablets, OR glucose gel. o Recheck blood sugar in 15 minutes after treatment (to make sure it is greater than 70 mg/dL). If your blood sugar is not greater than 70 mg/dL on  recheck, call [phone] for further instructions. . Report your blood sugar to the short stay nurse when you get to Short Stay.  . If you are admitted to the hospital after surgery: o Your blood sugar will be checked by the staff  and you will probably be given insulin after surgery (instead of oral diabetes medicines) to make sure you have good blood sugar levels. o The goal for blood sugar control after surgery is 80-180 mg/dL.   WHAT DO I DO ABOUT MY DIABETES MEDICATION?  Marland Kitchen Do not take oral diabetes medicines (pills) the morning of surgery.  . THE DAY BEFORE SURGERY, take Glipizide as usual in the morning.       . THE MORNING OF SURGERY, DO NOT take Glipizide.                                You may not have any metal on your body including hair pins and              piercings  Do not wear jewelry, make-up, lotions, powders or perfumes, deodorant             Men may shave face and neck.   Do not bring valuables to the hospital. [place].  Contacts, dentures or bridgework may not be worn into surgery.  Leave suitcase in the car. After surgery it may be brought to your room.     Patients discharged the day of surgery will not be allowed to drive home. IF YOU ARE HAVING SURGERY AND GOING HOME THE SAME DAY, YOU MUST HAVE AN ADULT TO DRIVE YOU HOME AND BE WITH YOU FOR 24 HOURS. YOU MAY GO HOME BY TAXI OR UBER OR ORTHERWISE, BUT AN ADULT MUST ACCOMPANY YOU HOME AND STAY WITH YOU FOR 24 HOURS.  Name and phone number of your driver:  Special Instructions: N/A              Please read over the following fact sheets you were given: _____________________________________________________________________         [facility] - Preparing for Surgery Before surgery, you can play an important role.  Because skin is not sterile, your skin needs to be as free of germs as possible.  You can reduce the number of germs on your skin by washing with  CHG (chlorahexidine gluconate) soap before surgery.  CHG is an antiseptic cleaner which kills germs and bonds with the skin to continue killing germs even after washing. Please DO NOT use if you have an allergy to CHG or antibacterial soaps.  If your skin becomes reddened/irritated stop using the CHG and inform your nurse when you arrive at Short Stay. Do not shave (including legs and underarms) for at least 48 hours prior to the first CHG shower.  You may shave your face/neck. Please follow these instructions carefully:  1.  Shower with CHG Soap the night before surgery and the  morning of Surgery.  2.  If you choose to wash your hair, wash your hair first as usual with your  normal  shampoo.  3.  After you shampoo, rinse your hair and body thoroughly to remove the  shampoo.                           4.  Use CHG as you would any other liquid soap.  You can apply chg directly  to the skin and wash  Gently with a scrungie or clean washcloth.  5.  Apply the CHG Soap to your body ONLY FROM THE NECK DOWN.   Do not use on face/ open                           Wound or open sores. Avoid contact with eyes, ears mouth and genitals (private parts).                       Wash face,  Genitals (private parts) with your normal soap.             6.  Wash thoroughly, paying special attention to the area where your surgery  will be performed.  7.  Thoroughly rinse your body with warm water from the neck down.  8.  DO NOT shower/wash with your normal soap after using and rinsing off  the CHG Soap.                9.  Pat yourself dry with a clean towel.            10.  Wear clean pajamas.            11.  Place clean sheets on your bed the night of your first shower and do not  sleep with pets. Day of Surgery : Do not apply any lotions/deodorants the morning of surgery.  Please wear clean clothes to the hospital/surgery center.  FAILURE TO FOLLOW THESE INSTRUCTIONS MAY RESULT IN THE CANCELLATION  OF YOUR SURGERY PATIENT SIGNATURE_________________________________  NURSE SIGNATURE__________________________________  ________________________________________________________________________  [facility]- Preparing for Total Shoulder Arthroplasty    Before surgery, you can play an important role. Because skin is not sterile, your skin needs to be as free of germs as possible. You can reduce the number of germs on your skin by using the following products. . Benzoyl Peroxide Gel o Reduces the number of germs present on the skin o Applied twice a day to shoulder area starting two days before surgery    ==================================================================  Please follow these instructions carefully:  BENZOYL PEROXIDE 5% GEL  Please do not use if you have an allergy to benzoyl peroxide.   If your skin becomes reddened/irritated stop using the benzoyl peroxide.  Starting two days before surgery, apply as follows: 1. Apply benzoyl peroxide in the morning and at night. Apply after taking a shower. If you are not taking a shower clean entire shoulder front, back, and side along with the armpit with a clean wet washcloth.  2. Place a quarter-sized dollop on your shoulder and rub in thoroughly, making sure to cover the front, back, and side of your shoulder, along with the armpit.   2 days before ____ AM   ____ PM              1 day before ____ AM   ____ PM                         3. Do this twice a day for two days.  (Last application is the night before surgery, AFTER using the CHG soap as described below).  4. Do NOT apply benzoyl peroxide gel on the day of surgery.   Incentive Spirometer  An incentive spirometer is a tool that can help keep your lungs clear and active. This tool measures how well you are filling your lungs with each  breath. Taking long deep breaths may help reverse or decrease the chance of developing breathing (pulmonary) problems (especially infection)  following:  A long period of time when you are unable to move or be active. BEFORE THE PROCEDURE   If the spirometer includes an indicator to show your best effort, your nurse or respiratory therapist will set it to a desired goal.  If possible, sit up straight or lean slightly forward. Try not to slouch.  Hold the incentive spirometer in an upright position. INSTRUCTIONS FOR USE  1. Sit on the edge of your bed if possible, or sit up as far as you can in bed or on a chair. 2. Hold the incentive spirometer in an upright position. 3. Breathe out normally. 4. Place the mouthpiece in your mouth and seal your lips tightly around it. 5. Breathe in slowly and as deeply as possible, raising the piston or the ball toward the top of the column. 6. Hold your breath for 3-5 seconds or for as long as possible. Allow the piston or ball to fall to the bottom of the column. 7. Remove the mouthpiece from your mouth and breathe out normally. 8. Rest for a few seconds and repeat Steps 1 through 7 at least 10 times every 1-2 hours when you are awake. Take your time and take a few normal breaths between deep breaths. 9. The spirometer may include an indicator to show your best effort. Use the indicator as a goal to work toward during each repetition. 10. After each set of 10 deep breaths, practice coughing to be sure your lungs are clear. If you have an incision (the cut made at the time of surgery), support your incision when coughing by placing a pillow or rolled up towels firmly against it. Once you are able to get out of bed, walk around indoors and cough well. You may stop using the incentive spirometer when instructed by your caregiver.  RISKS AND COMPLICATIONS  Take your time so you do not get dizzy or light-headed.  If you are in pain, you may need to take or ask for pain medication before doing incentive spirometry. It is harder to take a deep breath if you are having pain. AFTER USE  Rest and  breathe slowly and easily.  It can be helpful to keep track of a log of your progress. Your caregiver can provide you with a simple table to help with this. If you are using the spirometer at home, follow these instructions: [place] IF:   You are having difficultly using the spirometer.  You have trouble using the spirometer as often as instructed.  Your pain medication is not giving enough relief while using the spirometer.  You develop fever of 100.5 F (38.1 C) or higher. SEEK IMMEDIATE MEDICAL CARE IF:   You cough up bloody sputum that had not been present before.  You develop fever of 102 F (38.9 C) or greater.  You develop worsening pain at or near the incision site. MAKE SURE YOU:   Understand these instructions.  Will watch your condition.  Will get help right away if you are not doing well or get worse. Document Released: 02/20/2007 Document Revised: 01/02/2012 Document Reviewed: 04/23/2007 [facility] Patient Information 2014 [place], [place].   ________________________________________________________________________

## 2020-08-19 NOTE — Progress Notes (Signed)
Pt. Needs orders for upcomming surgery.PST appointment on: 08/20/20. Thanks.

## 2020-08-20 ENCOUNTER — Encounter (HOSPITAL_COMMUNITY)
Admission: RE | Admit: 2020-08-20 | Discharge: 2020-08-20 | Disposition: A | Discharge status: Home / Self Care | Payer: PPO | Source: Ambulatory Visit | Attending: Orthopedic Surgery | Admitting: Orthopedic Surgery

## 2020-08-20 ENCOUNTER — Other Ambulatory Visit: Payer: Self-pay

## 2020-08-20 ENCOUNTER — Encounter (HOSPITAL_COMMUNITY): Payer: Self-pay

## 2020-08-20 DIAGNOSIS — Z01818 Encounter for other preprocedural examination: Secondary | ICD-10-CM | POA: Diagnosis not present

## 2020-08-20 HISTORY — DX: Unspecified osteoarthritis, unspecified site: M19.90

## 2020-08-20 LAB — SURGICAL PCR SCREEN
MRSA, PCR: NEGATIVE
Staphylococcus aureus: NEGATIVE

## 2020-08-20 LAB — GLUCOSE, CAPILLARY: Glucose-Capillary: 79 mg/dL (ref 70–99)

## 2020-08-20 NOTE — Progress Notes (Signed)
COVID Vaccine Completed: yes Date COVID Vaccine completed: 12/31/19 COVID vaccine manufacturer: Pfizer     PCP - Dr. London Pepper. LOV: 04/14/20. : Clearance: 07/24/20. Chart. Cardiologist -   Chest x-ray - 04/10/20 EKG - 08/13/20 Chart. Stress Test -  ECHO - 2017 Cardiac Cath -  Pacemaker/ICD device last checked: A1-C: 5.9: 06/2020. Sleep Study -  CPAP -   Fasting Blood Sugar -  Checks Blood Sugar _____ times a day  Blood Thinner Instructions: Aspirin Instructions: Last Dose:  Anesthesia review:   Patient denies shortness of breath, fever, cough and chest pain at PAT appointment   Patient verbalized understanding of instructions that were given to them at the PAT appointment. Patient was also instructed that they will need to review over the PAT instructions again at home before surgery.

## 2020-08-24 DIAGNOSIS — L603 Nail dystrophy: Secondary | ICD-10-CM | POA: Diagnosis not present

## 2020-08-24 DIAGNOSIS — I739 Peripheral vascular disease, unspecified: Secondary | ICD-10-CM | POA: Diagnosis not present

## 2020-08-24 DIAGNOSIS — E1151 Type 2 diabetes mellitus with diabetic peripheral angiopathy without gangrene: Secondary | ICD-10-CM | POA: Diagnosis not present

## 2020-08-24 NOTE — Progress Notes (Signed)
Due to a death in the family, pt is unable to have his COVID test on 08-28-20 as patient will be out of town for the funeral.  Per discussion with Rayetta Humphrey, RN, okay to schedule patient for the [place] test on 08-29-20 when he return from the funeral in [place], and will be able to quarantine. Pt scheduled for his COVID test on 08-29-20@12 :05 PM.

## 2020-08-28 ENCOUNTER — Other Ambulatory Visit (HOSPITAL_COMMUNITY): Payer: PPO

## 2020-08-29 ENCOUNTER — Other Ambulatory Visit (HOSPITAL_COMMUNITY)
Admission: RE | Admit: 2020-08-29 | Discharge: 2020-08-29 | Disposition: A | Discharge status: Home / Self Care | Payer: PPO | Source: Ambulatory Visit | Attending: Orthopedic Surgery | Admitting: Orthopedic Surgery

## 2020-08-29 DIAGNOSIS — Z01812 Encounter for preprocedural laboratory examination: Secondary | ICD-10-CM | POA: Insufficient documentation

## 2020-08-29 DIAGNOSIS — Z20822 Contact with and (suspected) exposure to covid-19: Secondary | ICD-10-CM | POA: Diagnosis not present

## 2020-08-29 LAB — SARS CORONAVIRUS 2 (TAT 6-24 HRS): SARS Coronavirus 2: NEGATIVE

## 2020-08-31 NOTE — Anesthesia Preprocedure Evaluation (Addendum)
Anesthesia Evaluation  Patient identified by MRN, date of birth, ID band Patient awake    Reviewed: Allergy & Precautions, NPO status , Patient's Chart, lab work & pertinent test results  History of Anesthesia Complications Negative for: history of anesthetic complications  Airway Mallampati: II  TM Distance: >3 FB Neck ROM: Full    Dental  (+) Dental Advisory Given, Teeth Intact   Pulmonary sleep apnea and Continuous Positive Airway Pressure Ventilation , former smoker,    Pulmonary exam normal        Cardiovascular + Peripheral Vascular Disease and + DVT  Normal cardiovascular exam   '18 Carotid US - <50% b/l ICAS  '17 TTE - EF 65% to 70%. Grade 1 diastolic dysfunction. Mild TR.    Neuro/Psych negative neurological ROS  negative psych ROS   GI/Hepatic negative GI ROS, Neg liver ROS,   Endo/Other  diabetes, Type 2, Oral Hypoglycemic Agents Obesity   Renal/GU negative Renal ROS     Musculoskeletal  (+) Arthritis ,   Abdominal   Peds  Hematology negative hematology ROS (+)   Anesthesia Other Findings Covid test negative   Reproductive/Obstetrics                            Anesthesia Physical Anesthesia Plan  ASA: III  Anesthesia Plan: General   Post-op Pain Management:  Regional for Post-op pain   Induction: Intravenous  PONV Risk Score and Plan: 2 and Treatment may vary due to age or medical condition, Ondansetron and Dexamethasone  Airway Management Planned: Oral ETT  Additional Equipment: None  Intra-op Plan:   Post-operative Plan: Extubation in OR  Informed Consent: I have reviewed the patients History and Physical, chart, labs and discussed the procedure including the risks, benefits and alternatives for the proposed anesthesia with the patient or authorized representative who has indicated his/her understanding and acceptance.     Dental advisory given  Plan  Discussed with: CRNA and Anesthesiologist  Anesthesia Plan Comments:        Anesthesia Quick Evaluation

## 2020-09-01 ENCOUNTER — Ambulatory Visit (HOSPITAL_COMMUNITY): Payer: PPO

## 2020-09-01 ENCOUNTER — Encounter (HOSPITAL_COMMUNITY)
Admission: RE | Disposition: A | Discharge status: Home / Self Care | Payer: Self-pay | Source: Home / Self Care | Attending: Orthopedic Surgery

## 2020-09-01 ENCOUNTER — Ambulatory Visit (HOSPITAL_COMMUNITY)
Admission: RE | Admit: 2020-09-01 | Discharge: 2020-09-01 | Disposition: A | Discharge status: Home / Self Care | Payer: PPO | Attending: Orthopedic Surgery | Admitting: Orthopedic Surgery

## 2020-09-01 ENCOUNTER — Ambulatory Visit (HOSPITAL_COMMUNITY): Payer: PPO | Admitting: Anesthesiology

## 2020-09-01 ENCOUNTER — Encounter (HOSPITAL_COMMUNITY): Payer: Self-pay | Admitting: Orthopedic Surgery

## 2020-09-01 DIAGNOSIS — M19012 Primary osteoarthritis, left shoulder: Secondary | ICD-10-CM | POA: Insufficient documentation

## 2020-09-01 DIAGNOSIS — Z96612 Presence of left artificial shoulder joint: Secondary | ICD-10-CM | POA: Diagnosis not present

## 2020-09-01 DIAGNOSIS — E119 Type 2 diabetes mellitus without complications: Secondary | ICD-10-CM | POA: Diagnosis not present

## 2020-09-01 DIAGNOSIS — Z7982 Long term (current) use of aspirin: Secondary | ICD-10-CM | POA: Diagnosis not present

## 2020-09-01 DIAGNOSIS — M12812 Other specific arthropathies, not elsewhere classified, left shoulder: Secondary | ICD-10-CM | POA: Diagnosis not present

## 2020-09-01 DIAGNOSIS — M75122 Complete rotator cuff tear or rupture of left shoulder, not specified as traumatic: Secondary | ICD-10-CM | POA: Diagnosis not present

## 2020-09-01 DIAGNOSIS — Z79899 Other long term (current) drug therapy: Secondary | ICD-10-CM | POA: Diagnosis not present

## 2020-09-01 DIAGNOSIS — M24012 Loose body in left shoulder: Secondary | ICD-10-CM | POA: Insufficient documentation

## 2020-09-01 DIAGNOSIS — G8918 Other acute postprocedural pain: Secondary | ICD-10-CM | POA: Diagnosis not present

## 2020-09-01 DIAGNOSIS — Z87891 Personal history of nicotine dependence: Secondary | ICD-10-CM | POA: Insufficient documentation

## 2020-09-01 DIAGNOSIS — Z7984 Long term (current) use of oral hypoglycemic drugs: Secondary | ICD-10-CM | POA: Diagnosis not present

## 2020-09-01 DIAGNOSIS — Z471 Aftercare following joint replacement surgery: Secondary | ICD-10-CM | POA: Diagnosis not present

## 2020-09-01 HISTORY — PX: REVERSE SHOULDER ARTHROPLASTY: SHX5054

## 2020-09-01 LAB — GLUCOSE, CAPILLARY
Glucose-Capillary: 106 mg/dL — ABNORMAL HIGH (ref 70–99)
Glucose-Capillary: 133 mg/dL — ABNORMAL HIGH (ref 70–99)

## 2020-09-01 SURGERY — ARTHROPLASTY, SHOULDER, TOTAL, REVERSE
Anesthesia: General | Site: Shoulder | Laterality: Left

## 2020-09-01 MED ORDER — BACLOFEN 10 MG PO TABS: 10.0000 mg | ORAL_TABLET | Freq: Three times a day (TID) | ORAL | 0 refills | Status: AC

## 2020-09-01 MED ORDER — PROPOFOL 10 MG/ML IV BOLUS
INTRAVENOUS | Status: DC | PRN
  Administered 2020-09-01: 160 mg via INTRAVENOUS

## 2020-09-01 MED ORDER — ROCURONIUM BROMIDE 10 MG/ML (PF) SYRINGE
PREFILLED_SYRINGE | INTRAVENOUS | Status: DC | PRN
  Administered 2020-09-01: 70 mg via INTRAVENOUS

## 2020-09-01 MED ORDER — DEXAMETHASONE SODIUM PHOSPHATE 10 MG/ML IJ SOLN
INTRAMUSCULAR | Status: AC
  Filled 2020-09-01: qty 1

## 2020-09-01 MED ORDER — CHLORHEXIDINE GLUCONATE 0.12 % MT SOLN
15.0000 mL | Freq: Once | OROMUCOSAL | Status: AC
  Administered 2020-09-01: 15 mL via OROMUCOSAL

## 2020-09-01 MED ORDER — PHENYLEPHRINE HCL-NACL 10-0.9 MG/250ML-% IV SOLN
INTRAVENOUS | Status: AC
  Filled 2020-09-01: qty 250

## 2020-09-01 MED ORDER — ROCURONIUM BROMIDE 10 MG/ML (PF) SYRINGE
PREFILLED_SYRINGE | INTRAVENOUS | Status: AC
  Filled 2020-09-01: qty 10

## 2020-09-01 MED ORDER — LACTATED RINGERS IV BOLUS
250.0000 mL | Freq: Once | INTRAVENOUS | Status: AC
  Administered 2020-09-01: 250 mL via INTRAVENOUS

## 2020-09-01 MED ORDER — PHENYLEPHRINE HCL (PRESSORS) 10 MG/ML IV SOLN
INTRAVENOUS | Status: AC
  Filled 2020-09-01: qty 1

## 2020-09-01 MED ORDER — CEFAZOLIN SODIUM-DEXTROSE 2-4 GM/100ML-% IV SOLN: 2.0000 g | Freq: Four times a day (QID) | INTRAVENOUS | Status: DC

## 2020-09-01 MED ORDER — LACTATED RINGERS IV BOLUS
500.0000 mL | Freq: Once | INTRAVENOUS | Status: AC
  Administered 2020-09-01: 500 mL via INTRAVENOUS

## 2020-09-01 MED ORDER — PHENYLEPHRINE HCL-NACL 20-0.9 MG/250ML-% IV SOLN
INTRAVENOUS | Status: DC | PRN
  Administered 2020-09-01: 25 ug/min via INTRAVENOUS

## 2020-09-01 MED ORDER — RIVAROXABAN 10 MG PO TABS: 10.0000 mg | ORAL_TABLET | Freq: Every day | ORAL | 0 refills | Status: AC

## 2020-09-01 MED ORDER — ONDANSETRON HCL 4 MG PO TABS: 4.0000 mg | ORAL_TABLET | Freq: Three times a day (TID) | ORAL | 0 refills | Status: AC | PRN

## 2020-09-01 MED ORDER — LIDOCAINE 2% (20 MG/ML) 5 ML SYRINGE
INTRAMUSCULAR | Status: DC | PRN
  Administered 2020-09-01: 80 mg via INTRAVENOUS

## 2020-09-01 MED ORDER — BUPIVACAINE HCL (PF) 0.5 % IJ SOLN
INTRAMUSCULAR | Status: DC | PRN
  Administered 2020-09-01: 15 mL via PERINEURAL

## 2020-09-01 MED ORDER — LIDOCAINE 2% (20 MG/ML) 5 ML SYRINGE
INTRAMUSCULAR | Status: AC
  Filled 2020-09-01: qty 5

## 2020-09-01 MED ORDER — CEFAZOLIN SODIUM-DEXTROSE 2-4 GM/100ML-% IV SOLN
2.0000 g | INTRAVENOUS | Status: AC
  Administered 2020-09-01: 2 g via INTRAVENOUS
  Filled 2020-09-01: qty 100

## 2020-09-01 MED ORDER — LACTATED RINGERS IV SOLN: INTRAVENOUS | Status: DC

## 2020-09-01 MED ORDER — ORAL CARE MOUTH RINSE: 15.0000 mL | Freq: Once | OROMUCOSAL | Status: AC

## 2020-09-01 MED ORDER — DEXAMETHASONE SODIUM PHOSPHATE 10 MG/ML IJ SOLN
INTRAMUSCULAR | Status: DC | PRN
  Administered 2020-09-01: 10 mg via INTRAVENOUS

## 2020-09-01 MED ORDER — FENTANYL CITRATE (PF) 100 MCG/2ML IJ SOLN
INTRAMUSCULAR | Status: DC | PRN
  Administered 2020-09-01 (×2): 50 ug via INTRAVENOUS

## 2020-09-01 MED ORDER — ONDANSETRON HCL 4 MG/2ML IJ SOLN
INTRAMUSCULAR | Status: DC | PRN
  Administered 2020-09-01: 4 mg via INTRAVENOUS

## 2020-09-01 MED ORDER — SUGAMMADEX SODIUM 200 MG/2ML IV SOLN
INTRAVENOUS | Status: DC | PRN
  Administered 2020-09-01: 200 mg via INTRAVENOUS

## 2020-09-01 MED ORDER — ONDANSETRON HCL 4 MG/2ML IJ SOLN
INTRAMUSCULAR | Status: AC
  Filled 2020-09-01: qty 2

## 2020-09-01 MED ORDER — HYDROCODONE-ACETAMINOPHEN 5-325 MG PO TABS
1.0000 | ORAL_TABLET | Freq: Four times a day (QID) | ORAL | 0 refills | Status: AC | PRN
Start: 2020-09-01 — End: ?

## 2020-09-01 MED ORDER — OXYCODONE HCL 5 MG/5ML PO SOLN: 5.0000 mg | Freq: Once | ORAL | Status: DC | PRN

## 2020-09-01 MED ORDER — FENTANYL CITRATE (PF) 100 MCG/2ML IJ SOLN: 25.0000 ug | INTRAMUSCULAR | Status: DC | PRN

## 2020-09-01 MED ORDER — BUPIVACAINE LIPOSOME 1.3 % IJ SUSP
INTRAMUSCULAR | Status: DC | PRN
  Administered 2020-09-01: 10 mL via PERINEURAL

## 2020-09-01 MED ORDER — SENNA-DOCUSATE SODIUM 8.6-50 MG PO TABS: 2.0000 | ORAL_TABLET | Freq: Every day | ORAL | 1 refills | Status: AC

## 2020-09-01 MED ORDER — FENTANYL CITRATE (PF) 100 MCG/2ML IJ SOLN
INTRAMUSCULAR | Status: AC
  Filled 2020-09-01: qty 2

## 2020-09-01 MED ORDER — PROPOFOL 10 MG/ML IV BOLUS
INTRAVENOUS | Status: AC
  Filled 2020-09-01: qty 20

## 2020-09-01 MED ORDER — ONDANSETRON HCL 4 MG/2ML IJ SOLN: 4.0000 mg | Freq: Once | INTRAMUSCULAR | Status: DC | PRN

## 2020-09-01 MED ORDER — ACETAMINOPHEN 500 MG PO TABS
1000.0000 mg | ORAL_TABLET | Freq: Once | ORAL | Status: AC
  Administered 2020-09-01: 1000 mg via ORAL
  Filled 2020-09-01: qty 2

## 2020-09-01 MED ORDER — 0.9 % SODIUM CHLORIDE (POUR BTL) OPTIME
TOPICAL | Status: DC | PRN
  Administered 2020-09-01: 1000 mL

## 2020-09-01 MED ORDER — POVIDONE-IODINE 10 % EX SWAB
2.0000 "application " | Freq: Once | CUTANEOUS | Status: AC
  Administered 2020-09-01: 2 via TOPICAL

## 2020-09-01 MED ORDER — OXYCODONE HCL 5 MG PO TABS: 5.0000 mg | ORAL_TABLET | Freq: Once | ORAL | Status: DC | PRN

## 2020-09-01 MED ORDER — STERILE WATER FOR IRRIGATION IR SOLN
Status: DC | PRN
  Administered 2020-09-01: 2000 mL

## 2020-09-01 SURGICAL SUPPLY — 70 items
AID PSTN UNV HD RSTRNT DISP (MISCELLANEOUS) ×1
BAG SPEC THK2 15X12 ZIP CLS (MISCELLANEOUS) ×1
BAG ZIPLOCK 12X15 (MISCELLANEOUS) ×3 IMPLANT
BASEPLATE GLENOSPHERE 25 (Plate) ×1 IMPLANT
BASEPLATE GLENOSPHERE 25MM (Plate) ×1 IMPLANT
BEARING HUMERAL 40 STD VITE (Joint) ×2 IMPLANT
BIT DRILL QUICK REL 1/8 2PK SL (DRILL) IMPLANT
BIT DRILL TWIST 2.7 (BIT) ×1 IMPLANT
BIT DRILL TWIST 2.7MM (BIT) ×1
BLADE SAW SAG 73X25 THK (BLADE) ×2
BLADE SAW SGTL 73X25 THK (BLADE) ×1 IMPLANT
BOOTIES KNEE HIGH SLOAN (MISCELLANEOUS) ×3 IMPLANT
BOWL SMART MIX CTS (DISPOSABLE) IMPLANT
BRNG HUM STD 40 RVRS SHLDR (Joint) ×1 IMPLANT
CLOSURE STERI-STRIP 1/2X4 (GAUZE/BANDAGES/DRESSINGS) ×1
CLSR STERI-STRIP ANTIMIC 1/2X4 (GAUZE/BANDAGES/DRESSINGS) ×2 IMPLANT
COVER BACK TABLE 60X90IN (DRAPES) ×3 IMPLANT
COVER MAYO STAND STRL (DRAPES) ×3 IMPLANT
COVER SURGICAL LIGHT HANDLE (MISCELLANEOUS) ×3 IMPLANT
COVER WAND RF STERILE (DRAPES) IMPLANT
DECANTER SPIKE VIAL GLASS SM (MISCELLANEOUS) IMPLANT
DIAL VERSA SHOULDER 40 STD (Joint) ×2 IMPLANT
DRAPE ORTHO SPLIT 77X108 STRL (DRAPES)
DRAPE SHEET LG 3/4 BI-LAMINATE (DRAPES) ×6 IMPLANT
DRAPE SURG 17X11 SM STRL (DRAPES) ×3 IMPLANT
DRAPE SURG ORHT 6 SPLT 77X108 (DRAPES) IMPLANT
DRAPE U-SHAPE 47X51 STRL (DRAPES) ×3 IMPLANT
DRILL QUICK RELEASE 1/8 INCH (DRILL) ×3
DRSG MEPILEX BORDER 4X8 (GAUZE/BANDAGES/DRESSINGS) ×3 IMPLANT
DURAPREP 26ML APPLICATOR (WOUND CARE) ×3 IMPLANT
ELECT REM PT RETURN 15FT ADLT (MISCELLANEOUS) ×1 IMPLANT
GLOVE BIO SURGEON STRL SZ7 (GLOVE) ×3 IMPLANT
GLOVE BIOGEL PI IND STRL 7.0 (GLOVE) ×1 IMPLANT
GLOVE BIOGEL PI IND STRL 8 (GLOVE) ×1 IMPLANT
GLOVE BIOGEL PI INDICATOR 7.0 (GLOVE) ×2
GLOVE BIOGEL PI INDICATOR 8 (GLOVE) ×2
GLOVE ORTHO TXT STRL SZ7.5 (GLOVE) ×3 IMPLANT
GOWN STRL REUS W/TWL 2XL LVL3 (GOWN DISPOSABLE) ×3 IMPLANT
GOWN STRL REUS W/TWL LRG LVL3 (GOWN DISPOSABLE) ×3 IMPLANT
HOOD PEEL AWAY FLYTE STAYCOOL (MISCELLANEOUS) ×6 IMPLANT
KIT BASIN OR (CUSTOM PROCEDURE TRAY) ×3 IMPLANT
KIT TURNOVER KIT A (KITS) IMPLANT
PACK SHOULDER (CUSTOM PROCEDURE TRAY) ×3 IMPLANT
PENCIL SMOKE EVACUATOR (MISCELLANEOUS) IMPLANT
PIN THREADED REVERSE (PIN) ×2 IMPLANT
PROTECTOR NERVE ULNAR (MISCELLANEOUS) ×1 IMPLANT
RESTRAINT HEAD UNIVERSAL NS (MISCELLANEOUS) ×3 IMPLANT
SCREW BONE LOCKING 4.75X30X3.5 (Screw) ×2 IMPLANT
SCREW CENTRAL 6.5X20MM (Screw) ×2 IMPLANT
SCREW LOCKING 4.75MMX15MM (Screw) ×2 IMPLANT
SCREW LOCKING NS 4.75MMX20MM (Screw) ×2 IMPLANT
SCREW LOCKING STRL 4.75X25X3.5 (Screw) ×2 IMPLANT
SLING ARM FOAM STRAP LRG (SOFTGOODS) ×2 IMPLANT
SLING ARM IMMOBILIZER LRG (SOFTGOODS) ×1 IMPLANT
STEM SHOULDER 17MMX55MM LONG (Stem) ×2 IMPLANT
SUCTION FRAZIER HANDLE 12FR (TUBING) ×3
SUCTION TUBE FRAZIER 12FR DISP (TUBING) ×1 IMPLANT
SUPPORT WRAP ARM LG (MISCELLANEOUS) ×3 IMPLANT
SUT FIBERWIRE #2 38 REV NDL BL (SUTURE) ×12
SUT FIBERWIRE #2 38 T-5 BLUE (SUTURE) ×6
SUT VIC AB 1 CT1 36 (SUTURE) ×3 IMPLANT
SUT VIC AB 2-0 CT1 27 (SUTURE) ×3
SUT VIC AB 2-0 CT1 TAPERPNT 27 (SUTURE) ×1 IMPLANT
SUT VIC AB 3-0 SH 8-18 (SUTURE) ×3 IMPLANT
SUTURE FIBERWR #2 38 T-5 BLUE (SUTURE) IMPLANT
SUTURE FIBERWR#2 38 REV NDL BL (SUTURE) ×4 IMPLANT
TOWEL OR 17X26 10 PK STRL BLUE (TOWEL DISPOSABLE) ×3 IMPLANT
TOWEL OR NON WOVEN STRL DISP B (DISPOSABLE) ×3 IMPLANT
TOWER CARTRIDGE SMART MIX (DISPOSABLE) IMPLANT
TRAY HUM MINI SHOULDER +3 40 (Joint) ×2 IMPLANT

## 2020-09-01 NOTE — Discharge Instructions (Signed)
Diet: As you were doing prior to hospitalization  ? ?Shower:  May shower but keep the wounds dry, use an occlusive plastic wrap, NO SOAKING IN TUB.  If the bandage gets wet, change with a clean dry gauze.  If you have a splint on, leave the splint in place and keep the splint dry with a plastic bag. ? ?Dressing:  You may change your dressing 3-5 days after surgery, unless you have a splint.  If you have a splint, then just leave the splint in place and we will change your bandages during your first follow-up appointment.   ? ?If you had hand or foot surgery, we will plan to remove your stitches in about 2 weeks in the office.  For all other surgeries, there are sticky tapes (steri-strips) on your wounds and all the stitches are absorbable.  Leave the steri-strips in place when changing your dressings, they will peel off with time, usually 2-3 weeks. ? ?Activity:  Increase activity slowly as tolerated, but follow the weight bearing instructions below.  The rules on driving is that you can not be taking narcotics while you drive, and you must feel in control of the vehicle.   ? ?Weight Bearing:  No weight bearing with left arm. ? ?To prevent constipation: you may use a stool softener such as - ? ?Colace (over the counter) 100 mg by mouth twice a day  ?Drink plenty of fluids (prune juice may be helpful) and high fiber foods ?Miralax (over the counter) for constipation as needed.   ? ?Itching:  If you experience itching with your medications, try taking only a single pain pill, or even half a pain pill at a time.  You may take up to 10 pain pills per day, and you can also use benadryl over the counter for itching or also to help with sleep.  ? ?Precautions:  If you experience chest pain or shortness of breath - call 911 immediately for transfer to the hospital emergency department!! ? ?If you develop a fever greater that 101 F, purulent drainage from wound, increased redness or drainage from wound, or calf pain -- Call  the office at [phone]                                                ?Follow- Up Appointment:  Please call for an appointment to be seen in 2 weeks [place] - [phone] ? ? ?  ?

## 2020-09-01 NOTE — H&P (Signed)
Ian ARTHROPLASTY ADMISSION H&P  Patient ID: Ian Rhodes MRN: [id number] DOB/AGE: [birth year] 84 y.o.  Chief Complaint: left Ian pain.  Planned Procedure Date: 09/01/20 Medical Clearance by Dr. Orland Mustard Additional clearance by Dr. Jimmy Footman  HPI: Ian Rhodes is a 84 y.o. male who presents for evaluation of left Ian pain. He has had left Ian pain for many years.  He has had a total of three Ian scopes and has been diagnosed with rotator cuff arthropathy by Dr. Sharol Given.  Pain is moderate to severe and diffusely around the left Ian.  He has tried injections in the past, but these are no longer helping him.  He has had to have a significant amount of activity modification due to his decreasing range of motion and wants to preserve his function as much as possible. Patient has evidence of severe osteoarthritis seen on CT.    Past Medical History:  Diagnosis Date  . Arthritis   . BPH (benign prostatic hypertrophy)   . Carotid artery stenosis   . Colon polyps   . Diabetes mellitus    Type II  . DVT (deep venous thrombosis) (Ian Rhodes) 1995   r leg following surgery   . ED (erectile dysfunction)   . Hyperlipemia   . Kidney stones   . Macular degeneration   . Obesity   . Sleep apnea    very  mild not on CPAP   Past Surgical History:  Procedure Laterality Date  . EYE SURGERY     two surgeries  . KNEE ARTHROSCOPY, MEDIAL PATELLO FEMORAL LIGAMENT RECONSTRUCTION W/ HAMSTRING GRAFT     bilateral  . SPINAL FUSION    . TOTAL HIP ARTHROPLASTY  09/21/2011   Procedure: TOTAL HIP ARTHROPLASTY;  Surgeon: Newt Minion, MD;  Location: [facility];  Service: Orthopedics;  Laterality: Right;  Right Total Hip Arthroplasty   No Known Allergies Prior to Admission medications   Medication Sig Start Date End Date Taking? Authorizing Provider  aspirin 81 MG tablet Take 81 mg by mouth every evening.    Yes [provider]  calcitRIOL (ROCALTROL) 0.25 MCG capsule  Take 0.25 mcg by mouth daily. 03/26/20  Yes [provider]  cholecalciferol (VITAMIN D3) 25 MCG (1000 UNIT) tablet Take 1,000 Units by mouth daily.   Yes [provider]  fluticasone (FLONASE) 50 MCG/ACT nasal spray Place 1 spray into both nostrils daily as needed for allergies or rhinitis.   Yes [provider]  glipiZIDE (GLUCOTROL XL) 5 MG 24 hr tablet Take 5 mg by mouth every morning. 06/08/20  Yes [provider]  lisinopril (ZESTRIL) 5 MG tablet Take 5 mg by mouth at bedtime. 08/03/20  Yes [provider]  Multiple Vitamins-Minerals (PRESERVISION AREDS 2 PO) Take 1 capsule by mouth in the morning and at bedtime.   Yes [provider]  potassium citrate (UROCIT-K) 10 MEQ (1080 MG) SR tablet Take 20 mEq by mouth daily. 03/19/20  Yes [provider]  pravastatin (PRAVACHOL) 80 MG tablet Take 80 mg by mouth daily. 01/04/20  Yes [provider]  vitamin B-12 (CYANOCOBALAMIN) 100 MCG tablet Take 100 mcg by mouth daily.   Yes [provider]  vitamin C (ASCORBIC ACID) 250 MG tablet Take 250 mg by mouth daily.   Yes [provider]  acetaminophen (TYLENOL) 500 MG tablet Take 500 mg by mouth every 6 (six) hours as needed for moderate pain. Patient not taking: Reported on 08/13/2020    [provider]  Multiple Vitamins-Minerals (ICAPS) TABS Take 1 tablet by mouth daily. Patient not taking: Reported on 08/13/2020    [provider]  Multiple Vitamins-Minerals (ZINC PO) Take 1 tablet by mouth daily. Patient not taking: Reported on 08/13/2020    [provider]   Social History   Socioeconomic History  . Marital status: Married    Spouse name: Not on file  . Number of children: Not on file  . Years of education: Not on file  . Highest education level: Not on file  Occupational History  . Not on file  Tobacco Use  . Smoking status: Former Research scientist (life sciences)  . Smokeless tobacco: Former Systems developer     Quit date: 06/13/1994  Vaping Use  . Vaping Use: Never used  Substance and Sexual Activity  . Alcohol use: No  . Drug use: No  . Sexual activity: Not on file  Other Topics Concern  . Not on file  Social History Narrative  . Not on file   Social Determinants of Health   Financial Resource Strain:   . Difficulty of Paying Living Expenses: Not on file  Food Insecurity:   . Worried About Charity fundraiser in the Last Year: Not on file  . Ran Out of Food in the Last Year: Not on file  Transportation Needs:   . Lack of Transportation (Medical): Not on file  . Lack of Transportation (Non-Medical): Not on file  Physical Activity:   . Days of Exercise per Week: Not on file  . Minutes of Exercise per Session: Not on file  Stress:   . Feeling of Stress : Not on file  Social Connections:   . Frequency of Communication with Friends and Family: Not on file  . Frequency of Social Gatherings with Friends and Family: Not on file  . Attends Religious Services: Not on file  . Active Member of Clubs or Organizations: Not on file  . Attends Archivist Meetings: Not on file  . Marital Status: Not on file   Family History  Problem Relation Age of Onset  . Heart attack Father   . CAD Father     ROS: Currently denies lightheadedness, dizziness, Fever, chills, CP, SOB.   No personal history of PE, MI, or CVA. History of DVT in right leg. No loose teeth or dentures All other systems have been reviewed and were otherwise currently negative with the exception of those mentioned in the HPI and as above.  Objective: Vitals: HT: 6 feet, WT: 229 lb, TEMP: 46F, BP: 109/70, PULSE: 72 bpm, SpO2: 97% on room air.   Physical Exam: General: Alert, NAD.   HEENT: EOMI, Good Neck Extension  Pulm: No increased work of breathing.  Clear B/L A/P w/o crackle or wheeze. CV: RRR, No m/g/r appreciated  GI: soft, NT, ND Neuro: Neuro without gross focal deficit.  Sensation intact distally Skin: No  lesions in the area of chief complaint MSK/Surgical Site: 0-80 degrees of forward flexion of the left Ian; 10 degrees of external rotation and internal rotation to L5.  Imaging Review Plain radiographs and CT demonstrate severe degenerative joint disease of the left Ian.    Assessment: left Ian djd with coexisting rotator cuff arthropathy  Plan: Plan for Procedure(s): REVERSE Ian ARTHROPLASTY  The patient history, physical exam, clinical judgement of the provider and imaging are consistent with end stage degenerative joint disease and left Ian reverse joint arthroplasty is deemed medically necessary. The treatment options including medical management, injection  therapy, and arthroplasty were discussed at length. The risks and benefits of Procedure(s): REVERSE Ian ARTHROPLASTY were presented and reviewed.  The risks of nonoperative treatment, versus surgical intervention including but not limited to continued pain, aseptic loosening, stiffness, dislocation/subluxation, infection, bleeding, nerve injury, blood clots, cardiopulmonary complications, morbidity, mortality, among others were discussed. The patient verbalizes understanding and wishes to proceed with the plan.  Patient is being admitted for surgery, OT, pain control, prophylactic antibiotics, VTE prophylaxis, progressive ambulation, ADL's and discharge planning.   Dental prophylaxis discussed and recommended for 2 years postoperatively.    Patient's anticipated LOS is less than 2 midnights, meeting these requirements: - Younger than 76 - Lives within 1 hour of care - Has a competent adult at home to recover with post-op recover - NO history of  - Chronic pain requiring opiods  - Diabetes  - Coronary Artery Disease  - Heart failure  - Heart attack  - Stroke  - DVT/VTE  - Cardiac arrhythmia  - Respiratory Failure/COPD  - Renal failure  - Anemia  - Advanced Liver disease        Ventura Bruns, PA-C 09/01/2020 6:16 AM

## 2020-09-01 NOTE — Transfer of Care (Signed)
Immediate Anesthesia Transfer of Care Note  Patient: Iverson Alamin III  Procedure(s) Performed: REVERSE SHOULDER ARTHROPLASTY (Left Shoulder)  Patient Location: PACU  Anesthesia Type:General and Regional  Level of Consciousness: awake, alert  and oriented  Airway & Oxygen Therapy: Patient Spontanous Breathing and Patient connected to face mask oxygen  Post-op Assessment: Report given to RN and Post -op Vital signs reviewed and stable  Post vital signs: Reviewed and stable  Last Vitals:  Vitals Value Taken Time  BP 141/84 09/01/20 1041  Temp    Pulse 87 09/01/20 1042  Resp    SpO2 100 % 09/01/20 1042  Vitals shown include unvalidated device data.  Last Pain:  Vitals:   09/01/20 0633  TempSrc: Oral  PainSc:          Complications: No complications documented.

## 2020-09-01 NOTE — Anesthesia Procedure Notes (Signed)
Procedure Name: Intubation Date/Time: 09/01/2020 7:44 AM Performed by: Jabriel Vanduyne D, CRNA Pre-anesthesia Checklist: Patient identified, Emergency Drugs available, Suction available and Patient being monitored Patient Re-evaluated:Patient Re-evaluated prior to induction Oxygen Delivery Method: Circle system utilized Preoxygenation: Pre-oxygenation with 100% oxygen Induction Type: IV induction Ventilation: Mask ventilation without difficulty Laryngoscope Size: Mac and 4 Grade View: Grade I Tube type: Oral Tube size: 7.5 mm Number of attempts: 1 Airway Equipment and Method: Stylet and Oral airway Placement Confirmation: ETT inserted through vocal cords under direct vision,  positive ETCO2 and breath sounds checked- equal and bilateral Secured at: 22 cm Tube secured with: Tape Dental Injury: Teeth and Oropharynx as per pre-operative assessment

## 2020-09-01 NOTE — Evaluation (Signed)
Occupational Therapy Evaluation Patient Details Name: Ian Rhodes MRN: [id number] DOB: 1936/10/09 Today's Date: 09/01/2020    History of Present Illness Patient is a 84 year old male admitted 11/9 for L reverse total shoulder arthroplasty. PMH includes hip replacement, B knee arthroscopy, COVID   Clinical Impression   s/p shoulder replacement without functional use of left non-dominant upper extremity secondary to effects of surgery and interscalene block and shoulder precautions. Therapist provided education and instruction to patient and spouse in regards to exercises, precautions, positioning, donning upper extremity clothing and bathing while maintaining shoulder precautions, ice and edema management and donning/doffing sling. Patient and spouse verbalized understanding and demonstrated as needed. Patient needed assistance to donn shirt, underwear, pants, socks and shoes and provided with instruction on compensatory strategies to perform ADLs. Patient to follow up with MD for further therapy needs.      Follow Up Recommendations  Follow surgeon's recommendation for DC plan and follow-up therapies    Equipment Recommendations  None recommended by OT       Precautions / Restrictions Precautions Precautions: Shoulder Type of Shoulder Precautions: AROM elbow wrist and hand ok, P/AROM shoulder NO Shoulder Interventions: Shoulder sling/immobilizer;Off for dressing/bathing/exercises Precaution Booklet Issued: Yes (comment) Required Braces or Orthoses: Sling Restrictions Weight Bearing Restrictions: Yes LUE Weight Bearing: Non weight bearing      Mobility Bed Mobility               General bed mobility comments: in chair    Transfers Overall transfer level: Needs assistance Equipment used: None Transfers: Sit to/from Stand Sit to Stand: Supervision         General transfer comment: increased time to power up to standing, educate spouse in how to assist with  transfers if needed     Balance Overall balance assessment: Mild deficits observed, not formally tested                                         ADL either performed or assessed with clinical judgement   ADL Overall ADL's : Needs assistance/impaired         Upper Body Bathing: Minimal assistance;Sitting;Standing   Lower Body Bathing: Moderate assistance;Sitting/lateral leans;Sit to/from stand   Upper Body Dressing : Moderate assistance;Cueing for sequencing;Cueing for compensatory techniques;Cueing for UE precautions   Lower Body Dressing: Maximal assistance;Sitting/lateral leans;Sit to/from stand   Toilet Transfer: Supervision/safety   Toileting- Water quality scientist and Hygiene: Minimal assistance;Sitting/lateral lean;Sit to/from stand       Functional mobility during ADLs: Supervision/safety General ADL Comments: patient and spouse educated in compensatory strategies for self care in order to adhere to shoulder precautions                   Pertinent Vitals/Pain Pain Assessment: Faces Faces Pain Scale: Hurts a little bit Pain Location: L UE Pain Descriptors / Indicators: Heaviness Pain Intervention(s): Monitored during session     Hand Dominance Right   Extremity/Trunk Assessment Upper Extremity Assessment Upper Extremity Assessment: LUE deficits/detail LUE Deficits / Details: + nerve block   Lower Extremity Assessment Lower Extremity Assessment: Overall WFL for tasks assessed       Communication Communication Communication: No difficulties   Cognition Arousal/Alertness: Awake/alert Behavior During Therapy: WFL for tasks assessed/performed Overall Cognitive Status: Within Functional Limits for tasks assessed  Exercises Exercises: Shoulder   Shoulder Instructions Shoulder Instructions Donning/doffing shirt without moving shoulder: Moderate assistance;Patient able to  independently direct caregiver;Caregiver independent with task Method for sponge bathing under operated UE: Caregiver independent with task;Patient able to independently direct caregiver Donning/doffing sling/immobilizer: Caregiver independent with task;Patient able to independently direct caregiver Correct positioning of sling/immobilizer: Caregiver independent with task;Patient able to independently direct caregiver Pendulum exercises (written home exercise program):  (N/A) ROM for elbow, wrist and digits of operated UE: Patient able to independently direct caregiver;Caregiver independent with task Sling wearing schedule (on at all times/off for ADL's): Patient able to independently direct caregiver;Caregiver independent with task Proper positioning of operated UE when showering: Patient able to independently direct caregiver;Caregiver independent with task Positioning of UE while sleeping: Patient able to independently direct caregiver;Caregiver independent with task    Home Living Family/patient expects to be discharged to:: Private residence Living Arrangements: Spouse/significant other Available Help at Discharge: Family Type of Home: House Home Access: Stairs to enter Technical brewer of Steps: 2 Entrance Stairs-Rails: Left Home Layout: One level     Bathroom Shower/Tub: Occupational psychologist: Handicapped height     Home Equipment: Shower seat - built in;Hand held shower head          Prior Functioning/Environment Level of Independence: Independent                 OT Problem List: Pain;Impaired UE functional use         OT Goals(Current goals can be found in the care plan section) Acute Rehab OT Goals Patient Stated Goal: home OT Goal Formulation: With patient   AM-PAC OT "6 Clicks" Daily Activity     Outcome Measure Help from another person eating meals?: None Help from another person taking care of personal grooming?: A Little Help from  another person toileting, which includes using toliet, bedpan, or urinal?: A Little Help from another person bathing (including washing, rinsing, drying)?: A Lot Help from another person to put on and taking off regular upper body clothing?: A Lot Help from another person to put on and taking off regular lower body clothing?: A Lot 6 Click Score: 16   End of Session Equipment Utilized During Treatment: Other (comment) (sling) Nurse Communication: Mobility status  Activity Tolerance: Patient tolerated treatment well Patient left: in chair;with call bell/phone within reach;with family/visitor present  OT Visit Diagnosis: Pain Pain - Right/Left: Left Pain - part of body: Shoulder                Time: 9012-2241 OT Time Calculation (min): 30 min Charges:  OT General Charges $OT Visit: 1 Visit OT Evaluation $OT Eval Low Complexity: 1 Low OT Treatments $Self Care/Home Management : 8-22 mins  Delbert Phenix OT OT pager: [phone]  Rosemary Holms 09/01/2020, 12:37 PM

## 2020-09-01 NOTE — Anesthesia Procedure Notes (Signed)
Anesthesia Regional Block: Interscalene brachial plexus block   Pre-Anesthetic Checklist: ,, timeout performed, Correct Patient, Correct Site, Correct Laterality, Correct Procedure, Correct Position, site marked, Risks and benefits discussed,  Surgical consent,  Pre-op evaluation,  At surgeon's request and post-op pain management  Laterality: Left  Prep: chloraprep       Needles:  Injection technique: Single-shot  Needle Type: Echogenic Needle     Needle Length: 5cm  Needle Gauge: 21     Additional Needles:   Narrative:  Start time: 09/01/2020 6:58 AM End time: 09/01/2020 7:02 AM Injection made incrementally with aspirations every 5 mL.  Performed by: Personally  Anesthesiologist: Audry Pili, MD  Additional Notes: No pain on injection. No increased resistance to injection. Injection made in 5cc increments. Good needle visualization. Patient tolerated the procedure well.

## 2020-09-01 NOTE — Op Note (Signed)
09/01/2020  10:09 AM  PATIENT:  Ian Rhodes    PRE-OPERATIVE DIAGNOSIS: Left rotator cuff tear arthropathy  POST-OPERATIVE DIAGNOSIS:  Same  PROCEDURE: LEFT reverse Total Shoulder Arthroplasty  SURGEON:  Johnny Bridge, MD  PHYSICIAN ASSISTANT: Merlene Pulling, PA-C, present and scrubbed throughout the case, critical for completion in a timely fashion, and for retraction, instrumentation, and closure.  ANESTHESIA:   General with interscalene block using Exparel  ESTIMATED BLOOD LOSS: 150 mL  UNIQUE ASPECTS OF THE CASE: He has had 3 previous shoulder arthroscopies with Dr. Sharol Given, and was told that his rotator cuff was completely torn and there was nothing more to work with.  His subscapularis was intact, his supraspinatus was somewhat atrophic, but still present, I was concerned about the infraspinatus, I still had some rotator cuff to work with but I did not feel that a standard total shoulder would be in his best interest given the multiple arthroscopies, and I felt that his humeral head was very high on the posterior aspect as seen on the CAT scan.  Therefore I did proceed with a reverse total shoulder replacement, but interestingly enough at the end of the case I was able to get the rotator interval closed.  The other unique aspects of the case was that he had an extremely flattened head that was very large with a substantial massive amount of osteophyte formation.  There was also a large loose body located in the posterior aspect that was within the joint.  The glenoid itself had flattened out substantially and was fairly large, my central screw was only at 20, which I wondered if I may have been a little bit anterior on the start point.  I was adjacent with the anterior portion of the mini baseplate to the glenoid face, the posterior aspect still had a fair amount of bone, I placed the glenosphere on the in order to have appropriate inferior offset, the glenoid face was much larger  within the mini baseplate.  This was the only baseplate that we basically utilize at this point.  I did use the larger glenosphere in order to match the size of his glenoid.  The reduction at the end of the case was fairly easy, I debated whether it was too loose, but ended up maintaining this, as I did not take a very aggressive head cut, and did not medialize very much on the glenoid, so I felt that the soft tissue tension was appropriate.  The humeral stem itself was just a little bit undersized, I could have probably gotten a size larger but the 17 felt stable.  PREOPERATIVE INDICATIONS:  Ian Rhodes is a  84 y.o. male with a diagnosis of left shoulder rotator cuff arthropathy, and had failed 3 previous surgical interventions, and elected for surgical management.    The risks benefits and alternatives were discussed with the patient preoperatively including but not limited to the risks of infection, bleeding, nerve injury, cardiopulmonary complications, the need for revision surgery, dislocation, brachial plexus palsy, incomplete relief of pain, among others, and the patient was willing to proceed.  OPERATIVE IMPLANTS: Biomet size 17 micro humeral stem press-fit with a 40 + 3 mm offset reverse shoulder arthroplasty tray with a 40 mm Vivacit-E standardy polyethylene liner and a 40 mm glenosphere set on D placed with inferior offset, with a mini baseplate and 4 locking screws and one central nonlocking screw.  OPERATIVE FINDINGS: He had a large loose body contained within the  joint posteriorly.  Substantial osteophyte formation diffusely.  Significant thinning and atrophy of the superior and posterior cuff.  Advanced severe glenohumeral osteoarthritis.  OPERATIVE PROCEDURE: The patient was brought to the operating room and placed in the supine position. General anesthesia was administered. IV antibiotics were given.  Time out was performed. The upper extremity was prepped and draped in usual  sterile fashion. The patient was in a beachchair position. Deltopectoral approach was carried out. The biceps was tenodesed to the pectoralis tendon with #2 Fiberwire. The subscapularis was released off of the bone.   I then performed circumferential releases of the humerus, and then dislocated the head, and then reamed with the reamer to the above named size.  I then applied the jig, and cut the humeral head in 30 of retroversion, and then turned my attention to the glenoid.  Deep retractors were placed, and I resected the labrum, and then placed a guidepin into the center position on the glenoid, with slight inferior inclination. I then reamed over the guidepin, and this created a small metaphyseal cancellus blush inferiorly, removing just the cartilage to the subchondral bone superiorly. The base plate was selected and impacted place, and then I secured it centrally with a nonlocking screw, and I had excellent purchase both inferiorly and superiorly. I placed a short locking screws on anterior and posterior aspects.  I then turned my attention to the glenosphere, and impacted this into place, placing slight inferior offset (set on D).   The glenosphere was completely seated, and had engagement of the [facility] taper. I then turned my attention back to the humerus.  I sequentially broached, and then trialed, and was found to restore soft tissue tension, and it had 2 finger tightness. Therefore the above named components were selected. The shoulder felt stable throughout functional motion.  Before I placed the real prosthesis I had also placed a total of 3 #2 FiberWire through drill holes in the humerus for later subscapularis repair.  I then impacted the real prosthesis into place, as well as the real humeral tray, and reduced the shoulder. The shoulder had excellent motion, and was stable, and I irrigated the wounds copiously.     I then used these to repair the subscapularis. This came down to  bone.  I also repaired the rotator interval with a total of 2 additional #2 FiberWire sutures.  I then irrigated the shoulder copiously once more, repaired the deltopectoral interval with Vicryl followed by subcutaneous Vicryl with Steri-Strips and sterile gauze for the skin. The patient was awakened and returned back in stable and satisfactory condition. There were no complications and He tolerated the procedure well.

## 2020-09-01 NOTE — Anesthesia Postprocedure Evaluation (Signed)
Anesthesia Post Note  Patient: Ian Rhodes  Procedure(s) Performed: REVERSE SHOULDER ARTHROPLASTY (Left Shoulder)     Patient location during evaluation: PACU Anesthesia Type: General Level of consciousness: awake and alert Pain management: pain level controlled Vital Signs Assessment: post-procedure vital signs reviewed and stable Respiratory status: spontaneous breathing, nonlabored ventilation and respiratory function stable Cardiovascular status: blood pressure returned to baseline and stable Postop Assessment: no apparent nausea or vomiting Anesthetic complications: no   No complications documented.  Last Vitals:  Vitals:   09/01/20 1121 09/01/20 1145  BP: 131/66 117/64  Pulse: 89 97  Resp: 18 16  Temp: 36.6 C   SpO2: 96% 94%    Last Pain:  Vitals:   09/01/20 1121  TempSrc:   PainSc: 0-No pain                 Audry Pili

## 2020-09-02 ENCOUNTER — Encounter (HOSPITAL_COMMUNITY): Payer: Self-pay | Admitting: Orthopedic Surgery

## 2020-09-14 DIAGNOSIS — E1122 Type 2 diabetes mellitus with diabetic chronic kidney disease: Secondary | ICD-10-CM | POA: Diagnosis not present

## 2020-09-14 DIAGNOSIS — M19012 Primary osteoarthritis, left shoulder: Secondary | ICD-10-CM | POA: Diagnosis not present

## 2020-09-14 DIAGNOSIS — E669 Obesity, unspecified: Secondary | ICD-10-CM | POA: Diagnosis not present

## 2020-09-14 DIAGNOSIS — N400 Enlarged prostate without lower urinary tract symptoms: Secondary | ICD-10-CM | POA: Diagnosis not present

## 2020-09-14 DIAGNOSIS — N184 Chronic kidney disease, stage 4 (severe): Secondary | ICD-10-CM | POA: Diagnosis not present

## 2020-09-14 DIAGNOSIS — R609 Edema, unspecified: Secondary | ICD-10-CM | POA: Diagnosis not present

## 2020-09-14 DIAGNOSIS — N2581 Secondary hyperparathyroidism of renal origin: Secondary | ICD-10-CM | POA: Diagnosis not present

## 2020-09-14 DIAGNOSIS — I129 Hypertensive chronic kidney disease with stage 1 through stage 4 chronic kidney disease, or unspecified chronic kidney disease: Secondary | ICD-10-CM | POA: Diagnosis not present

## 2020-09-14 DIAGNOSIS — N200 Calculus of kidney: Secondary | ICD-10-CM | POA: Diagnosis not present

## 2020-09-14 DIAGNOSIS — E118 Type 2 diabetes mellitus with unspecified complications: Secondary | ICD-10-CM | POA: Diagnosis not present

## 2020-09-14 DIAGNOSIS — D631 Anemia in chronic kidney disease: Secondary | ICD-10-CM | POA: Diagnosis not present

## 2020-09-14 DIAGNOSIS — N189 Chronic kidney disease, unspecified: Secondary | ICD-10-CM | POA: Diagnosis not present

## 2020-10-07 IMAGING — US US RENAL
1 series · 14 of 25 positions shown · non-contrast
Comparison: Renal ultrasound 09/07/2015

CLINICAL DATA: Acute kidney injury.

EXAM:
RENAL / URINARY TRACT ULTRASOUND COMPLETE

[Series 1: us renal · 14 of 59 slices shown]
[im 1/59]
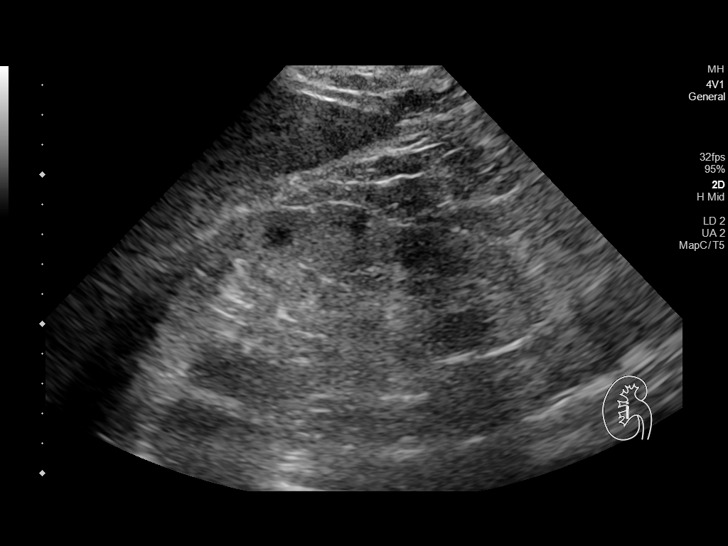
[im 5/59]
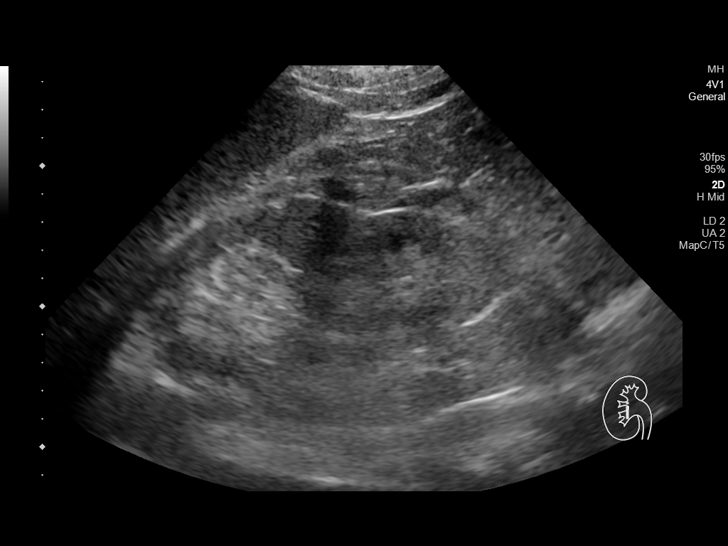
[im 10/59]
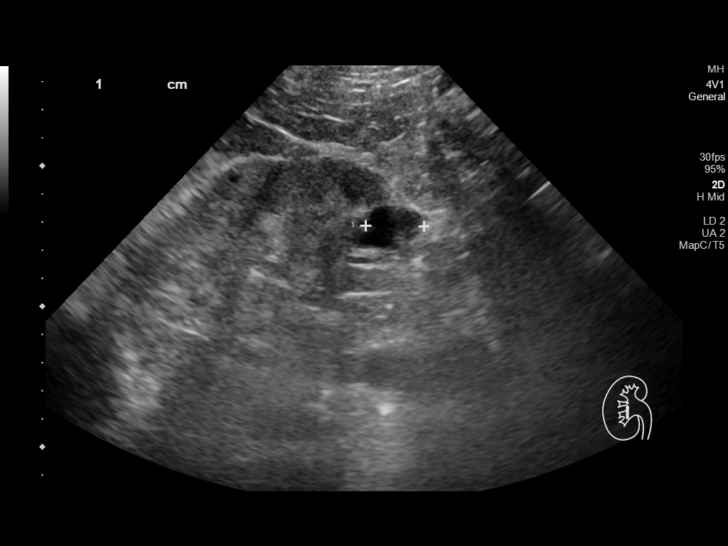
[im 15/59]
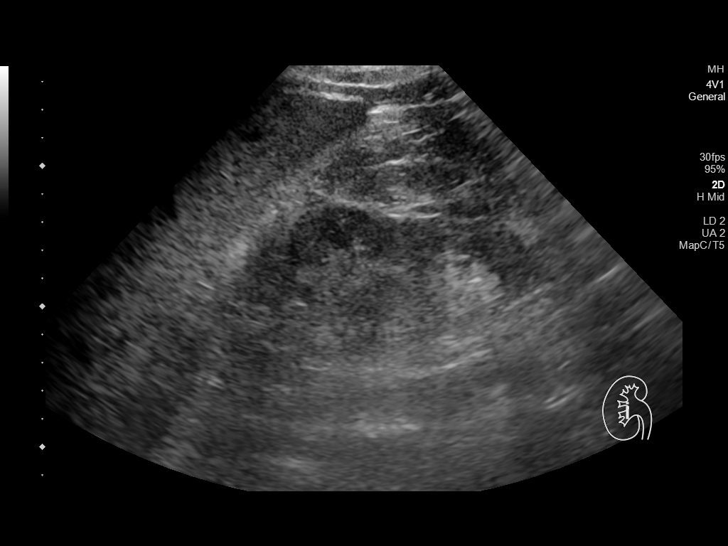
[im 20/59]
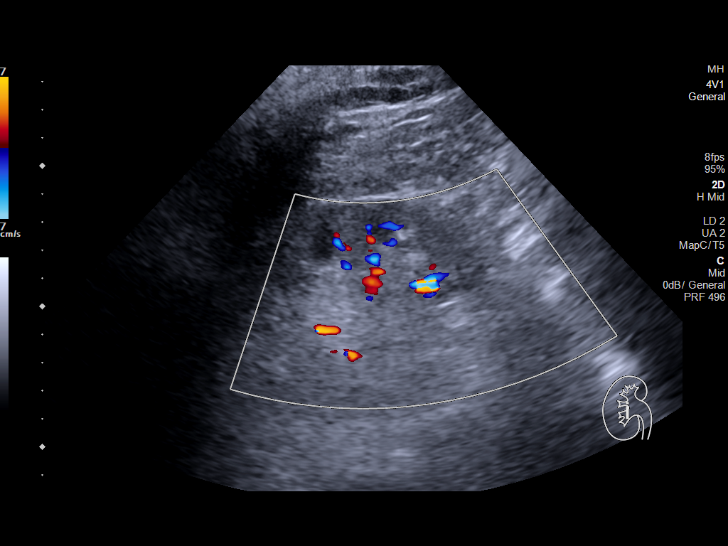
[im 22/59]
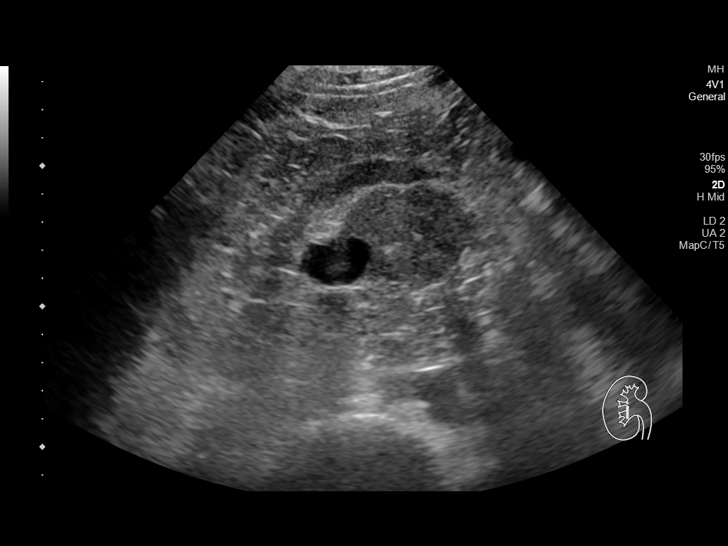
[im 27/59]
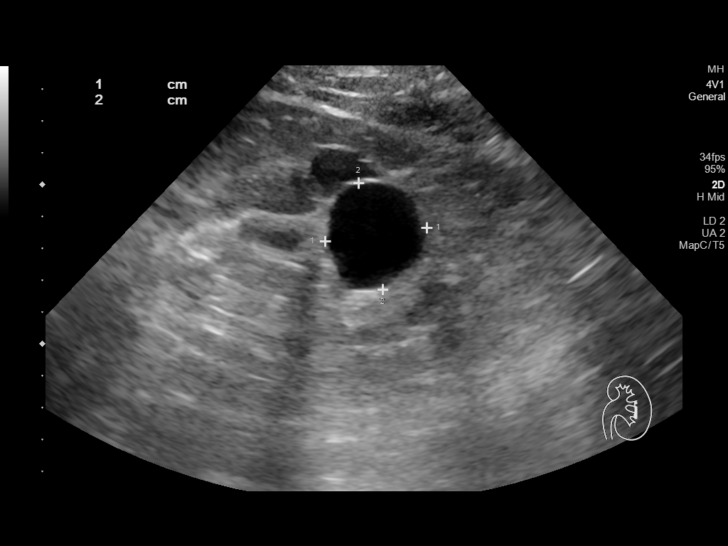
[im 32/59]
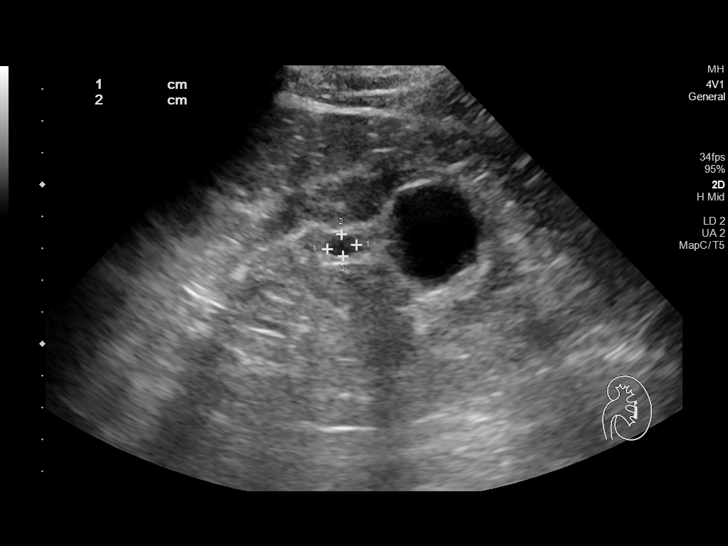
[im 37/59]
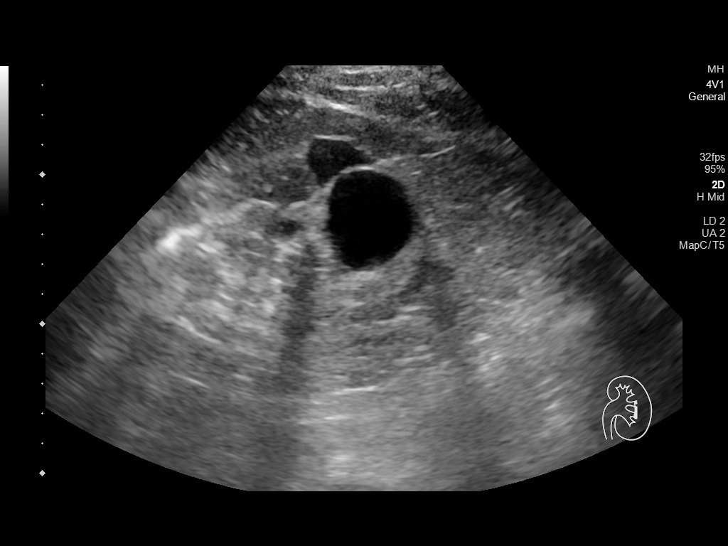
[im 39/59]
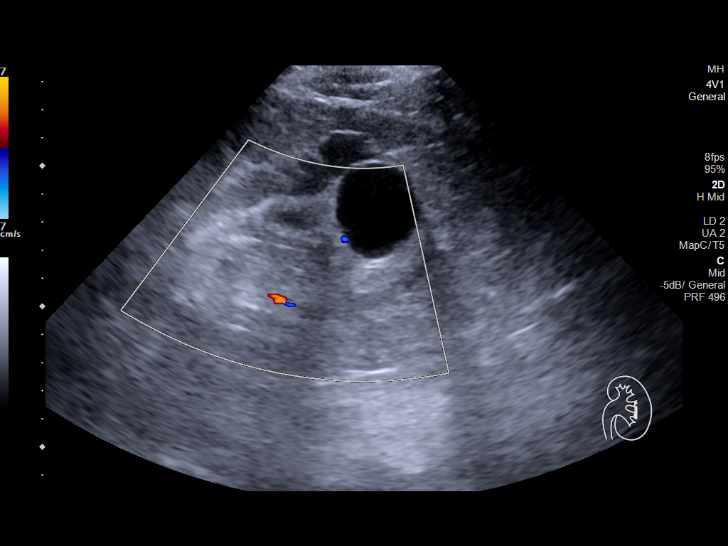
[im 44/59]
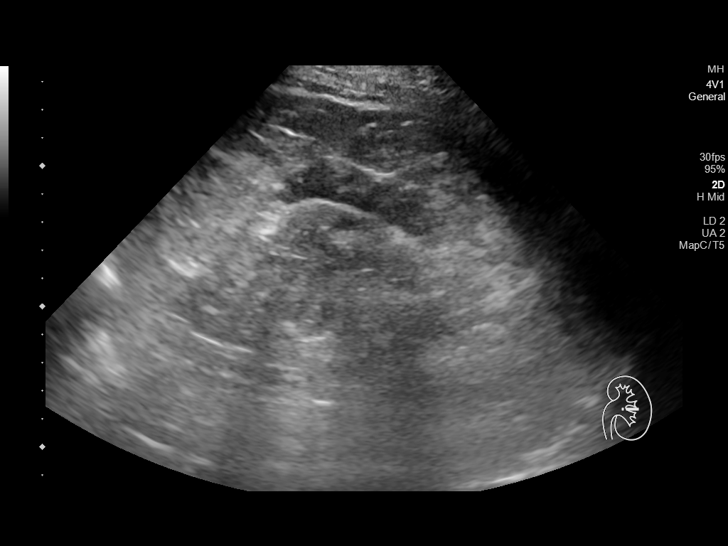
[im 49/59]
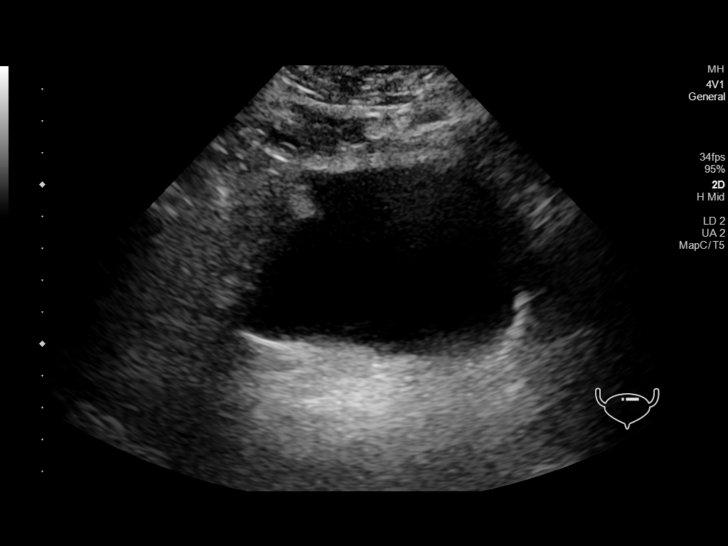
[im 54/59]
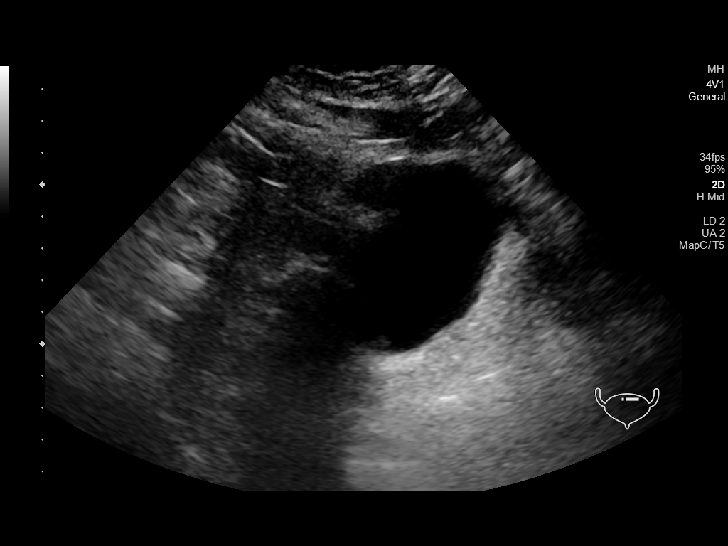
[im 59/59]
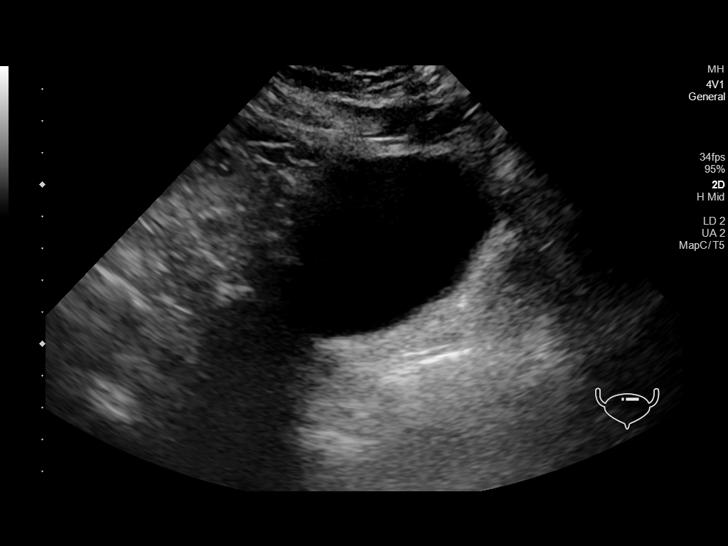

[14 of 25 positions shown; findings below may reference images not displayed]

FINDINGS: Right Kidney:

Renal measurements: 13.0 x 6.1 x 6.3 cm = volume: 262 mL. Diffusely
increased renal parenchymal echogenicity with mild thinning of the
renal parenchyma. Renal cysts, largest measuring 2.3 and 2.2 cm. No
evidence of solid lesion. No hydronephrosis.

Left Kidney:

Renal measurements: 11.1 x 5.1 x 4.4 cm = volume: 132 mL. Diffusely
increased renal parenchymal echogenicity and renal cortical
thinning. Renal cysts, largest measuring 4.8 cm. This cyst has a
thin internal septation. No evidence of solid or suspicious lesion.
No hydronephrosis. Previous stone is not well visualized on the
current exam.

Bladder:

Appears normal for degree of bladder distention.

Other:

None.
IMPRESSION: 1. No hydronephrosis or obstructive uropathy.
2. Bilateral renal parenchymal thinning with increased renal
echogenicity consistent with chronic medical renal disease.
3. Bilateral renal cysts.

## 2020-10-12 DIAGNOSIS — M19012 Primary osteoarthritis, left shoulder: Secondary | ICD-10-CM | POA: Diagnosis not present

## 2020-10-19 DIAGNOSIS — M19012 Primary osteoarthritis, left shoulder: Secondary | ICD-10-CM | POA: Diagnosis not present

## 2020-10-19 DIAGNOSIS — M25612 Stiffness of left shoulder, not elsewhere classified: Secondary | ICD-10-CM | POA: Diagnosis not present

## 2020-10-19 DIAGNOSIS — M6281 Muscle weakness (generalized): Secondary | ICD-10-CM | POA: Diagnosis not present

## 2020-10-30 DIAGNOSIS — M19012 Primary osteoarthritis, left shoulder: Secondary | ICD-10-CM | POA: Diagnosis not present

## 2020-10-30 DIAGNOSIS — M25612 Stiffness of left shoulder, not elsewhere classified: Secondary | ICD-10-CM | POA: Diagnosis not present

## 2020-10-30 DIAGNOSIS — J019 Acute sinusitis, unspecified: Secondary | ICD-10-CM | POA: Diagnosis not present

## 2020-10-30 DIAGNOSIS — M6281 Muscle weakness (generalized): Secondary | ICD-10-CM | POA: Diagnosis not present

## 2020-11-02 DIAGNOSIS — M19012 Primary osteoarthritis, left shoulder: Secondary | ICD-10-CM | POA: Diagnosis not present

## 2020-11-02 DIAGNOSIS — M6281 Muscle weakness (generalized): Secondary | ICD-10-CM | POA: Diagnosis not present

## 2020-11-02 DIAGNOSIS — M25612 Stiffness of left shoulder, not elsewhere classified: Secondary | ICD-10-CM | POA: Diagnosis not present

## 2020-11-11 DIAGNOSIS — M6281 Muscle weakness (generalized): Secondary | ICD-10-CM | POA: Diagnosis not present

## 2020-11-11 DIAGNOSIS — M19012 Primary osteoarthritis, left shoulder: Secondary | ICD-10-CM | POA: Diagnosis not present

## 2020-11-11 DIAGNOSIS — M25612 Stiffness of left shoulder, not elsewhere classified: Secondary | ICD-10-CM | POA: Diagnosis not present

## 2020-11-16 DIAGNOSIS — M19012 Primary osteoarthritis, left shoulder: Secondary | ICD-10-CM | POA: Diagnosis not present

## 2020-11-16 DIAGNOSIS — M25612 Stiffness of left shoulder, not elsewhere classified: Secondary | ICD-10-CM | POA: Diagnosis not present

## 2020-11-16 DIAGNOSIS — M6281 Muscle weakness (generalized): Secondary | ICD-10-CM | POA: Diagnosis not present

## 2020-11-18 DIAGNOSIS — M19012 Primary osteoarthritis, left shoulder: Secondary | ICD-10-CM | POA: Diagnosis not present

## 2020-11-23 DIAGNOSIS — M6281 Muscle weakness (generalized): Secondary | ICD-10-CM | POA: Diagnosis not present

## 2020-11-23 DIAGNOSIS — M19012 Primary osteoarthritis, left shoulder: Secondary | ICD-10-CM | POA: Diagnosis not present

## 2020-11-23 DIAGNOSIS — M25612 Stiffness of left shoulder, not elsewhere classified: Secondary | ICD-10-CM | POA: Diagnosis not present

## 2020-11-24 DIAGNOSIS — E1141 Type 2 diabetes mellitus with diabetic mononeuropathy: Secondary | ICD-10-CM | POA: Diagnosis not present

## 2020-12-10 DIAGNOSIS — E669 Obesity, unspecified: Secondary | ICD-10-CM | POA: Diagnosis not present

## 2020-12-10 DIAGNOSIS — N200 Calculus of kidney: Secondary | ICD-10-CM | POA: Diagnosis not present

## 2020-12-10 DIAGNOSIS — N184 Chronic kidney disease, stage 4 (severe): Secondary | ICD-10-CM | POA: Diagnosis not present

## 2020-12-10 DIAGNOSIS — D631 Anemia in chronic kidney disease: Secondary | ICD-10-CM | POA: Diagnosis not present

## 2020-12-10 DIAGNOSIS — E1122 Type 2 diabetes mellitus with diabetic chronic kidney disease: Secondary | ICD-10-CM | POA: Diagnosis not present

## 2020-12-10 DIAGNOSIS — I129 Hypertensive chronic kidney disease with stage 1 through stage 4 chronic kidney disease, or unspecified chronic kidney disease: Secondary | ICD-10-CM | POA: Diagnosis not present

## 2020-12-10 DIAGNOSIS — N400 Enlarged prostate without lower urinary tract symptoms: Secondary | ICD-10-CM | POA: Diagnosis not present

## 2020-12-10 DIAGNOSIS — R609 Edema, unspecified: Secondary | ICD-10-CM | POA: Diagnosis not present

## 2020-12-10 DIAGNOSIS — N2581 Secondary hyperparathyroidism of renal origin: Secondary | ICD-10-CM | POA: Diagnosis not present

## 2020-12-18 DIAGNOSIS — E559 Vitamin D deficiency, unspecified: Secondary | ICD-10-CM | POA: Diagnosis not present

## 2020-12-18 DIAGNOSIS — I6521 Occlusion and stenosis of right carotid artery: Secondary | ICD-10-CM | POA: Diagnosis not present

## 2020-12-18 DIAGNOSIS — Z0000 Encounter for general adult medical examination without abnormal findings: Secondary | ICD-10-CM | POA: Diagnosis not present

## 2020-12-18 DIAGNOSIS — E7800 Pure hypercholesterolemia, unspecified: Secondary | ICD-10-CM | POA: Diagnosis not present

## 2020-12-18 DIAGNOSIS — N184 Chronic kidney disease, stage 4 (severe): Secondary | ICD-10-CM | POA: Diagnosis not present

## 2020-12-18 DIAGNOSIS — E1122 Type 2 diabetes mellitus with diabetic chronic kidney disease: Secondary | ICD-10-CM | POA: Diagnosis not present

## 2021-01-29 DIAGNOSIS — I951 Orthostatic hypotension: Secondary | ICD-10-CM | POA: Diagnosis not present

## 2021-01-29 DIAGNOSIS — E1122 Type 2 diabetes mellitus with diabetic chronic kidney disease: Secondary | ICD-10-CM | POA: Diagnosis not present

## 2021-01-29 DIAGNOSIS — R42 Dizziness and giddiness: Secondary | ICD-10-CM | POA: Diagnosis not present

## 2021-02-03 DIAGNOSIS — M19012 Primary osteoarthritis, left shoulder: Secondary | ICD-10-CM | POA: Diagnosis not present

## 2021-02-03 DIAGNOSIS — T8484XA Pain due to internal orthopedic prosthetic devices, implants and grafts, initial encounter: Secondary | ICD-10-CM | POA: Diagnosis not present

## 2021-02-03 DIAGNOSIS — M48061 Spinal stenosis, lumbar region without neurogenic claudication: Secondary | ICD-10-CM | POA: Diagnosis not present

## 2021-02-08 DIAGNOSIS — H35033 Hypertensive retinopathy, bilateral: Secondary | ICD-10-CM | POA: Diagnosis not present

## 2021-02-08 DIAGNOSIS — E1122 Type 2 diabetes mellitus with diabetic chronic kidney disease: Secondary | ICD-10-CM | POA: Diagnosis not present

## 2021-02-22 DIAGNOSIS — E1151 Type 2 diabetes mellitus with diabetic peripheral angiopathy without gangrene: Secondary | ICD-10-CM | POA: Diagnosis not present

## 2021-02-22 DIAGNOSIS — I739 Peripheral vascular disease, unspecified: Secondary | ICD-10-CM | POA: Diagnosis not present

## 2021-02-22 DIAGNOSIS — L603 Nail dystrophy: Secondary | ICD-10-CM | POA: Diagnosis not present

## 2021-02-26 DIAGNOSIS — M25551 Pain in right hip: Secondary | ICD-10-CM | POA: Diagnosis not present

## 2021-02-26 DIAGNOSIS — M545 Low back pain, unspecified: Secondary | ICD-10-CM | POA: Diagnosis not present

## 2021-03-10 DIAGNOSIS — E1122 Type 2 diabetes mellitus with diabetic chronic kidney disease: Secondary | ICD-10-CM | POA: Diagnosis not present

## 2021-03-10 DIAGNOSIS — N2581 Secondary hyperparathyroidism of renal origin: Secondary | ICD-10-CM | POA: Diagnosis not present

## 2021-03-10 DIAGNOSIS — N400 Enlarged prostate without lower urinary tract symptoms: Secondary | ICD-10-CM | POA: Diagnosis not present

## 2021-03-10 DIAGNOSIS — I129 Hypertensive chronic kidney disease with stage 1 through stage 4 chronic kidney disease, or unspecified chronic kidney disease: Secondary | ICD-10-CM | POA: Diagnosis not present

## 2021-03-10 DIAGNOSIS — R809 Proteinuria, unspecified: Secondary | ICD-10-CM | POA: Diagnosis not present

## 2021-03-10 DIAGNOSIS — R609 Edema, unspecified: Secondary | ICD-10-CM | POA: Diagnosis not present

## 2021-03-10 DIAGNOSIS — N200 Calculus of kidney: Secondary | ICD-10-CM | POA: Diagnosis not present

## 2021-03-10 DIAGNOSIS — D631 Anemia in chronic kidney disease: Secondary | ICD-10-CM | POA: Diagnosis not present

## 2021-03-10 DIAGNOSIS — E669 Obesity, unspecified: Secondary | ICD-10-CM | POA: Diagnosis not present

## 2021-03-10 DIAGNOSIS — N184 Chronic kidney disease, stage 4 (severe): Secondary | ICD-10-CM | POA: Diagnosis not present

## 2021-04-09 DIAGNOSIS — J0101 Acute recurrent maxillary sinusitis: Secondary | ICD-10-CM | POA: Diagnosis not present

## 2021-05-05 ENCOUNTER — Encounter (INDEPENDENT_AMBULATORY_CARE_PROVIDER_SITE_OTHER): Payer: PPO | Admitting: Ophthalmology

## 2021-05-05 ENCOUNTER — Other Ambulatory Visit: Payer: Self-pay

## 2021-05-05 DIAGNOSIS — H353111 Nonexudative age-related macular degeneration, right eye, early dry stage: Secondary | ICD-10-CM | POA: Diagnosis not present

## 2021-05-05 DIAGNOSIS — H35343 Macular cyst, hole, or pseudohole, bilateral: Secondary | ICD-10-CM

## 2021-05-05 DIAGNOSIS — H43811 Vitreous degeneration, right eye: Secondary | ICD-10-CM

## 2021-05-13 DIAGNOSIS — J320 Chronic maxillary sinusitis: Secondary | ICD-10-CM | POA: Diagnosis not present

## 2021-05-24 DIAGNOSIS — E1141 Type 2 diabetes mellitus with diabetic mononeuropathy: Secondary | ICD-10-CM | POA: Diagnosis not present

## 2021-06-14 DIAGNOSIS — N184 Chronic kidney disease, stage 4 (severe): Secondary | ICD-10-CM | POA: Diagnosis not present

## 2021-06-14 DIAGNOSIS — E1122 Type 2 diabetes mellitus with diabetic chronic kidney disease: Secondary | ICD-10-CM | POA: Diagnosis not present

## 2021-06-14 DIAGNOSIS — E7800 Pure hypercholesterolemia, unspecified: Secondary | ICD-10-CM | POA: Diagnosis not present

## 2021-06-14 DIAGNOSIS — E559 Vitamin D deficiency, unspecified: Secondary | ICD-10-CM | POA: Diagnosis not present

## 2021-06-14 DIAGNOSIS — R252 Cramp and spasm: Secondary | ICD-10-CM | POA: Diagnosis not present

## 2021-06-14 DIAGNOSIS — Z7984 Long term (current) use of oral hypoglycemic drugs: Secondary | ICD-10-CM | POA: Diagnosis not present

## 2021-06-16 DIAGNOSIS — N184 Chronic kidney disease, stage 4 (severe): Secondary | ICD-10-CM | POA: Diagnosis not present

## 2021-06-16 DIAGNOSIS — E1122 Type 2 diabetes mellitus with diabetic chronic kidney disease: Secondary | ICD-10-CM | POA: Diagnosis not present

## 2021-06-16 DIAGNOSIS — D631 Anemia in chronic kidney disease: Secondary | ICD-10-CM | POA: Diagnosis not present

## 2021-06-16 DIAGNOSIS — N2581 Secondary hyperparathyroidism of renal origin: Secondary | ICD-10-CM | POA: Diagnosis not present

## 2021-06-16 DIAGNOSIS — I129 Hypertensive chronic kidney disease with stage 1 through stage 4 chronic kidney disease, or unspecified chronic kidney disease: Secondary | ICD-10-CM | POA: Diagnosis not present

## 2021-06-16 DIAGNOSIS — R809 Proteinuria, unspecified: Secondary | ICD-10-CM | POA: Diagnosis not present

## 2021-06-16 DIAGNOSIS — N400 Enlarged prostate without lower urinary tract symptoms: Secondary | ICD-10-CM | POA: Diagnosis not present

## 2021-06-16 DIAGNOSIS — N200 Calculus of kidney: Secondary | ICD-10-CM | POA: Diagnosis not present

## 2021-06-16 DIAGNOSIS — R609 Edema, unspecified: Secondary | ICD-10-CM | POA: Diagnosis not present

## 2021-06-16 DIAGNOSIS — E669 Obesity, unspecified: Secondary | ICD-10-CM | POA: Diagnosis not present

## 2021-06-23 ENCOUNTER — Ambulatory Visit (INDEPENDENT_AMBULATORY_CARE_PROVIDER_SITE_OTHER): Payer: PPO | Admitting: Otolaryngology

## 2021-06-23 ENCOUNTER — Other Ambulatory Visit: Payer: Self-pay

## 2021-06-23 DIAGNOSIS — J320 Chronic maxillary sinusitis: Secondary | ICD-10-CM | POA: Diagnosis not present

## 2021-06-23 MED ORDER — TRIAMCINOLONE ACETONIDE 55 MCG/ACT NA AERO: 2.0000 | INHALATION_SPRAY | Freq: Every day | NASAL | 12 refills | Status: AC

## 2021-06-23 NOTE — Progress Notes (Signed)
HPI: Ian Rhodes is a 85 y.o. male who presents is referred by Dr. Maurice Small, MD for evaluation of chronic maxillary sinus infections.  He states that he has had problems with his sinuses for a number of years.  Usually he has more problems during the winter months and they clear up during the warmer summer months but this past year he had more of a persistent infection since last winter.  He has been on a couple rounds of antibiotics and is doing better presently.  The right side is always worse than the left side and he points to the right cheek area. I reviewed an MRI scan that he had performed of his head performed in February 2020 that showed bilateral maxillary sinus disease worse on the right side. He has occasionally used Flonase but has not used any nasal steroid sprays on a regular basis.  Past Medical History:  Diagnosis Date   Arthritis    BPH (benign prostatic hypertrophy)    Carotid artery stenosis    Colon polyps    Diabetes mellitus    Type II   DVT (deep venous thrombosis) ([place]) 1995   r leg following surgery    ED (erectile dysfunction)    Hyperlipemia    Kidney stones    Macular degeneration    Obesity    Sleep apnea    very  mild not on CPAP   Past Surgical History:  Procedure Laterality Date   EYE SURGERY     two surgeries   KNEE ARTHROSCOPY, MEDIAL PATELLO FEMORAL LIGAMENT RECONSTRUCTION W/ HAMSTRING GRAFT     bilateral   REVERSE SHOULDER ARTHROPLASTY Left 09/01/2020   Procedure: REVERSE SHOULDER ARTHROPLASTY;  Surgeon: Marchia Bond, MD;  Location: [facility];  Service: Orthopedics;  Laterality: Left;   SPINAL FUSION     TOTAL HIP ARTHROPLASTY  09/21/2011   Procedure: TOTAL HIP ARTHROPLASTY;  Surgeon: Newt Minion, MD;  Location: [facility];  Service: Orthopedics;  Laterality: Right;  Right Total Hip Arthroplasty   Social History   Socioeconomic History   Marital status: Married    Spouse name: Not on file   Number of children: Not on file   Years of  education: Not on file   Highest education level: Not on file  Occupational History   Not on file  Tobacco Use   Smoking status: Former   Smokeless tobacco: Former    Quit date: 06/13/1994  Vaping Use   Vaping Use: Never used  Substance and Sexual Activity   Alcohol use: No   Drug use: No   Sexual activity: Not on file  Other Topics Concern   Not on file  Social History Narrative   Not on file   Social Determinants of Health   Financial Resource Strain: Not on file  Food Insecurity: Not on file  Transportation Needs: Not on file  Physical Activity: Not on file  Stress: Not on file  Social Connections: Not on file   Family History  Problem Relation Age of Onset   Heart attack Father    CAD Father    No Known Allergies Prior to Admission medications   Medication Sig Start Date End Date Taking? Authorizing Provider  baclofen (LIORESAL) 10 MG tablet Take 1 tablet (10 mg total) by mouth 3 (three) times daily. As needed for muscle spasm 09/01/20   Merlene Pulling K, PA-C  calcitRIOL (ROCALTROL) 0.25 MCG capsule Take 0.25 mcg by mouth daily. 03/26/20   [provider]  cholecalciferol (VITAMIN D3) 25 MCG (1000 UNIT) tablet Take 1,000 Units by mouth daily.    [provider]  fluticasone (FLONASE) 50 MCG/ACT nasal spray Place 1 spray into both nostrils daily as needed for allergies or rhinitis.    [provider]  glipiZIDE (GLUCOTROL XL) 5 MG 24 hr tablet Take 5 mg by mouth every morning. 06/08/20   [provider]  HYDROcodone-acetaminophen (NORCO) 5-325 MG tablet Take 1-2 tablets by mouth every 6 (six) hours as needed for moderate pain. MAXIMUM TOTAL ACETAMINOPHEN DOSE IS 4000 MG PER DAY 09/01/20   Merlene Pulling K, PA-C  lisinopril (ZESTRIL) 5 MG tablet Take 5 mg by mouth at bedtime. 08/03/20   [provider]  Multiple Vitamins-Minerals (PRESERVISION AREDS 2 PO) Take 1 capsule by mouth in the morning and at bedtime.    [provider]  ondansetron (ZOFRAN) 4 MG tablet Take 1 tablet (4 mg total) by mouth every 8 (eight) hours as needed for nausea or vomiting. 09/01/20   Merlene Pulling K, PA-C  potassium citrate (UROCIT-K) 10 MEQ (1080 MG) SR tablet Take 20 mEq by mouth daily. 03/19/20   [provider]  pravastatin (PRAVACHOL) 80 MG tablet Take 80 mg by mouth daily. 01/04/20   [provider]  rivaroxaban (XARELTO) 10 MG TABS tablet Take 1 tablet (10 mg total) by mouth daily. 09/01/20   Merlene Pulling K, PA-C  sennosides-docusate sodium (SENOKOT-S) 8.6-50 MG tablet Take 2 tablets by mouth daily. 09/01/20   Ventura Bruns, PA-C  vitamin B-12 (CYANOCOBALAMIN) 100 MCG tablet Take 100 mcg by mouth daily.    [provider]  vitamin C (ASCORBIC ACID) 250 MG tablet Take 250 mg by mouth daily.    [provider]     Positive ROS: Otherwise negative  All other systems have been reviewed and were otherwise negative with the exception of those mentioned in the HPI and as above.  Physical Exam: Constitutional: Alert, well-appearing, no acute distress Ears: External ears without lesions or tenderness. Ear canals are clear bilaterally with intact, clear TMs.  Nasal: External nose without lesions. Septum is slightly deviated to the right.  On nasal endoscopy in the office today the left middle meatus region was clear with no mucopurulent discharge no polyps.  However the right middle meatus region is swollen with some polypoid changes and what appears to be obstruction of the right maxillary sinus. Oral: Lips and gums without lesions. Tongue and palate mucosa without lesions. Posterior oropharynx clear. Neck: No palpable adenopathy or masses Respiratory: Breathing comfortably  Skin: No facial/neck lesions or rash noted.  Procedures  Assessment: Chronic right maxillary sinus disease with polypoid changes.  Plan: Reviewed with the patient in the office today that he appears to have chronic right  maxillary sinus obstruction and might benefit by surgical intervention.  I would initially use nasal steroid spray and saline rinses on a regular basis and prescribed Nasacort 2 sprays each nostril at night for the next few months.  If symptoms worsen he will contact us earlier.  Briefly discussed surgical options with him and he would like to wait at this time.Radene Journey, MD   CC:

## 2021-08-30 DIAGNOSIS — I739 Peripheral vascular disease, unspecified: Secondary | ICD-10-CM | POA: Diagnosis not present

## 2021-08-30 DIAGNOSIS — E1151 Type 2 diabetes mellitus with diabetic peripheral angiopathy without gangrene: Secondary | ICD-10-CM | POA: Diagnosis not present

## 2021-08-30 DIAGNOSIS — L603 Nail dystrophy: Secondary | ICD-10-CM | POA: Diagnosis not present

## 2021-09-15 DIAGNOSIS — D631 Anemia in chronic kidney disease: Secondary | ICD-10-CM | POA: Diagnosis not present

## 2021-09-15 DIAGNOSIS — N184 Chronic kidney disease, stage 4 (severe): Secondary | ICD-10-CM | POA: Diagnosis not present

## 2021-09-15 DIAGNOSIS — R609 Edema, unspecified: Secondary | ICD-10-CM | POA: Diagnosis not present

## 2021-09-15 DIAGNOSIS — E1122 Type 2 diabetes mellitus with diabetic chronic kidney disease: Secondary | ICD-10-CM | POA: Diagnosis not present

## 2021-09-15 DIAGNOSIS — N400 Enlarged prostate without lower urinary tract symptoms: Secondary | ICD-10-CM | POA: Diagnosis not present

## 2021-09-15 DIAGNOSIS — N200 Calculus of kidney: Secondary | ICD-10-CM | POA: Diagnosis not present

## 2021-09-15 DIAGNOSIS — N189 Chronic kidney disease, unspecified: Secondary | ICD-10-CM | POA: Diagnosis not present

## 2021-09-15 DIAGNOSIS — E669 Obesity, unspecified: Secondary | ICD-10-CM | POA: Diagnosis not present

## 2021-09-15 DIAGNOSIS — N2581 Secondary hyperparathyroidism of renal origin: Secondary | ICD-10-CM | POA: Diagnosis not present

## 2021-09-15 DIAGNOSIS — I129 Hypertensive chronic kidney disease with stage 1 through stage 4 chronic kidney disease, or unspecified chronic kidney disease: Secondary | ICD-10-CM | POA: Diagnosis not present

## 2021-09-15 DIAGNOSIS — R809 Proteinuria, unspecified: Secondary | ICD-10-CM | POA: Diagnosis not present

## 2021-11-30 DIAGNOSIS — E1142 Type 2 diabetes mellitus with diabetic polyneuropathy: Secondary | ICD-10-CM | POA: Diagnosis not present

## 2021-12-15 DIAGNOSIS — N2581 Secondary hyperparathyroidism of renal origin: Secondary | ICD-10-CM | POA: Diagnosis not present

## 2021-12-15 DIAGNOSIS — E669 Obesity, unspecified: Secondary | ICD-10-CM | POA: Diagnosis not present

## 2021-12-15 DIAGNOSIS — E1122 Type 2 diabetes mellitus with diabetic chronic kidney disease: Secondary | ICD-10-CM | POA: Diagnosis not present

## 2021-12-15 DIAGNOSIS — N200 Calculus of kidney: Secondary | ICD-10-CM | POA: Diagnosis not present

## 2021-12-15 DIAGNOSIS — R809 Proteinuria, unspecified: Secondary | ICD-10-CM | POA: Diagnosis not present

## 2021-12-15 DIAGNOSIS — I129 Hypertensive chronic kidney disease with stage 1 through stage 4 chronic kidney disease, or unspecified chronic kidney disease: Secondary | ICD-10-CM | POA: Diagnosis not present

## 2021-12-15 DIAGNOSIS — D631 Anemia in chronic kidney disease: Secondary | ICD-10-CM | POA: Diagnosis not present

## 2021-12-15 DIAGNOSIS — N184 Chronic kidney disease, stage 4 (severe): Secondary | ICD-10-CM | POA: Diagnosis not present

## 2021-12-15 DIAGNOSIS — R609 Edema, unspecified: Secondary | ICD-10-CM | POA: Diagnosis not present

## 2021-12-15 DIAGNOSIS — N400 Enlarged prostate without lower urinary tract symptoms: Secondary | ICD-10-CM | POA: Diagnosis not present

## 2022-01-21 DIAGNOSIS — E1122 Type 2 diabetes mellitus with diabetic chronic kidney disease: Secondary | ICD-10-CM | POA: Diagnosis not present

## 2022-01-21 DIAGNOSIS — N184 Chronic kidney disease, stage 4 (severe): Secondary | ICD-10-CM | POA: Diagnosis not present

## 2022-01-21 DIAGNOSIS — I6521 Occlusion and stenosis of right carotid artery: Secondary | ICD-10-CM | POA: Diagnosis not present

## 2022-01-21 DIAGNOSIS — E559 Vitamin D deficiency, unspecified: Secondary | ICD-10-CM | POA: Diagnosis not present

## 2022-01-21 DIAGNOSIS — Z0000 Encounter for general adult medical examination without abnormal findings: Secondary | ICD-10-CM | POA: Diagnosis not present

## 2022-01-21 DIAGNOSIS — E7800 Pure hypercholesterolemia, unspecified: Secondary | ICD-10-CM | POA: Diagnosis not present

## 2022-01-27 ENCOUNTER — Other Ambulatory Visit: Payer: Self-pay | Admitting: Family Medicine

## 2022-01-27 DIAGNOSIS — N184 Chronic kidney disease, stage 4 (severe): Secondary | ICD-10-CM

## 2022-01-31 ENCOUNTER — Ambulatory Visit
Admission: RE | Admit: 2022-01-31 | Discharge: 2022-01-31 | Disposition: A | Discharge status: Home / Self Care | Payer: PPO | Source: Ambulatory Visit | Attending: Family Medicine | Admitting: Family Medicine

## 2022-01-31 DIAGNOSIS — I6523 Occlusion and stenosis of bilateral carotid arteries: Secondary | ICD-10-CM | POA: Diagnosis not present

## 2022-01-31 DIAGNOSIS — N184 Chronic kidney disease, stage 4 (severe): Secondary | ICD-10-CM

## 2022-03-01 DIAGNOSIS — E1151 Type 2 diabetes mellitus with diabetic peripheral angiopathy without gangrene: Secondary | ICD-10-CM | POA: Diagnosis not present

## 2022-03-01 DIAGNOSIS — I739 Peripheral vascular disease, unspecified: Secondary | ICD-10-CM | POA: Diagnosis not present

## 2022-03-09 DIAGNOSIS — I129 Hypertensive chronic kidney disease with stage 1 through stage 4 chronic kidney disease, or unspecified chronic kidney disease: Secondary | ICD-10-CM | POA: Diagnosis not present

## 2022-03-09 DIAGNOSIS — E669 Obesity, unspecified: Secondary | ICD-10-CM | POA: Diagnosis not present

## 2022-03-09 DIAGNOSIS — N184 Chronic kidney disease, stage 4 (severe): Secondary | ICD-10-CM | POA: Diagnosis not present

## 2022-03-09 DIAGNOSIS — E1122 Type 2 diabetes mellitus with diabetic chronic kidney disease: Secondary | ICD-10-CM | POA: Diagnosis not present

## 2022-03-09 DIAGNOSIS — D631 Anemia in chronic kidney disease: Secondary | ICD-10-CM | POA: Diagnosis not present

## 2022-03-09 DIAGNOSIS — N200 Calculus of kidney: Secondary | ICD-10-CM | POA: Diagnosis not present

## 2022-03-09 DIAGNOSIS — N400 Enlarged prostate without lower urinary tract symptoms: Secondary | ICD-10-CM | POA: Diagnosis not present

## 2022-03-09 DIAGNOSIS — N2581 Secondary hyperparathyroidism of renal origin: Secondary | ICD-10-CM | POA: Diagnosis not present

## 2022-03-09 DIAGNOSIS — R609 Edema, unspecified: Secondary | ICD-10-CM | POA: Diagnosis not present

## 2022-03-09 DIAGNOSIS — R809 Proteinuria, unspecified: Secondary | ICD-10-CM | POA: Diagnosis not present

## 2022-03-14 DIAGNOSIS — R42 Dizziness and giddiness: Secondary | ICD-10-CM | POA: Diagnosis not present

## 2022-03-14 DIAGNOSIS — N184 Chronic kidney disease, stage 4 (severe): Secondary | ICD-10-CM | POA: Diagnosis not present

## 2022-03-14 DIAGNOSIS — I6521 Occlusion and stenosis of right carotid artery: Secondary | ICD-10-CM | POA: Diagnosis not present

## 2022-05-05 ENCOUNTER — Encounter (INDEPENDENT_AMBULATORY_CARE_PROVIDER_SITE_OTHER): Payer: PPO | Admitting: Ophthalmology

## 2022-05-05 DIAGNOSIS — H35033 Hypertensive retinopathy, bilateral: Secondary | ICD-10-CM | POA: Diagnosis not present

## 2022-05-05 DIAGNOSIS — I10 Essential (primary) hypertension: Secondary | ICD-10-CM | POA: Diagnosis not present

## 2022-05-05 DIAGNOSIS — H353111 Nonexudative age-related macular degeneration, right eye, early dry stage: Secondary | ICD-10-CM | POA: Diagnosis not present

## 2022-05-05 DIAGNOSIS — E113291 Type 2 diabetes mellitus with mild nonproliferative diabetic retinopathy without macular edema, right eye: Secondary | ICD-10-CM

## 2022-05-05 DIAGNOSIS — H35343 Macular cyst, hole, or pseudohole, bilateral: Secondary | ICD-10-CM | POA: Diagnosis not present

## 2022-05-05 DIAGNOSIS — H43811 Vitreous degeneration, right eye: Secondary | ICD-10-CM | POA: Diagnosis not present

## 2022-05-21 DIAGNOSIS — R52 Pain, unspecified: Secondary | ICD-10-CM | POA: Diagnosis not present

## 2022-05-21 DIAGNOSIS — U071 COVID-19: Secondary | ICD-10-CM | POA: Diagnosis not present

## 2022-05-31 DIAGNOSIS — L84 Corns and callosities: Secondary | ICD-10-CM | POA: Diagnosis not present

## 2022-05-31 DIAGNOSIS — E1151 Type 2 diabetes mellitus with diabetic peripheral angiopathy without gangrene: Secondary | ICD-10-CM | POA: Diagnosis not present

## 2022-05-31 DIAGNOSIS — L603 Nail dystrophy: Secondary | ICD-10-CM | POA: Diagnosis not present

## 2022-05-31 DIAGNOSIS — I739 Peripheral vascular disease, unspecified: Secondary | ICD-10-CM | POA: Diagnosis not present

## 2022-06-01 DIAGNOSIS — U071 COVID-19: Secondary | ICD-10-CM | POA: Diagnosis not present

## 2022-06-21 DIAGNOSIS — E7800 Pure hypercholesterolemia, unspecified: Secondary | ICD-10-CM | POA: Diagnosis not present

## 2022-06-21 DIAGNOSIS — N184 Chronic kidney disease, stage 4 (severe): Secondary | ICD-10-CM | POA: Diagnosis not present

## 2022-06-21 DIAGNOSIS — E1122 Type 2 diabetes mellitus with diabetic chronic kidney disease: Secondary | ICD-10-CM | POA: Diagnosis not present

## 2022-07-08 DIAGNOSIS — R809 Proteinuria, unspecified: Secondary | ICD-10-CM | POA: Diagnosis not present

## 2022-07-08 DIAGNOSIS — I129 Hypertensive chronic kidney disease with stage 1 through stage 4 chronic kidney disease, or unspecified chronic kidney disease: Secondary | ICD-10-CM | POA: Diagnosis not present

## 2022-07-08 DIAGNOSIS — D631 Anemia in chronic kidney disease: Secondary | ICD-10-CM | POA: Diagnosis not present

## 2022-07-08 DIAGNOSIS — N2581 Secondary hyperparathyroidism of renal origin: Secondary | ICD-10-CM | POA: Diagnosis not present

## 2022-07-08 DIAGNOSIS — N184 Chronic kidney disease, stage 4 (severe): Secondary | ICD-10-CM | POA: Diagnosis not present

## 2022-07-08 DIAGNOSIS — R609 Edema, unspecified: Secondary | ICD-10-CM | POA: Diagnosis not present

## 2022-07-08 DIAGNOSIS — N200 Calculus of kidney: Secondary | ICD-10-CM | POA: Diagnosis not present

## 2022-07-08 DIAGNOSIS — E669 Obesity, unspecified: Secondary | ICD-10-CM | POA: Diagnosis not present

## 2022-07-08 DIAGNOSIS — N400 Enlarged prostate without lower urinary tract symptoms: Secondary | ICD-10-CM | POA: Diagnosis not present

## 2022-07-08 DIAGNOSIS — E1122 Type 2 diabetes mellitus with diabetic chronic kidney disease: Secondary | ICD-10-CM | POA: Diagnosis not present

## 2022-07-11 DIAGNOSIS — R809 Proteinuria, unspecified: Secondary | ICD-10-CM | POA: Diagnosis not present

## 2022-07-25 DIAGNOSIS — E1122 Type 2 diabetes mellitus with diabetic chronic kidney disease: Secondary | ICD-10-CM | POA: Diagnosis not present

## 2022-07-25 DIAGNOSIS — E7800 Pure hypercholesterolemia, unspecified: Secondary | ICD-10-CM | POA: Diagnosis not present

## 2022-07-25 DIAGNOSIS — E559 Vitamin D deficiency, unspecified: Secondary | ICD-10-CM | POA: Diagnosis not present

## 2022-07-25 DIAGNOSIS — N184 Chronic kidney disease, stage 4 (severe): Secondary | ICD-10-CM | POA: Diagnosis not present

## 2022-08-08 DIAGNOSIS — R42 Dizziness and giddiness: Secondary | ICD-10-CM | POA: Diagnosis not present

## 2022-08-08 DIAGNOSIS — N184 Chronic kidney disease, stage 4 (severe): Secondary | ICD-10-CM | POA: Diagnosis not present

## 2022-08-08 DIAGNOSIS — E1122 Type 2 diabetes mellitus with diabetic chronic kidney disease: Secondary | ICD-10-CM | POA: Diagnosis not present

## 2022-08-30 DIAGNOSIS — I739 Peripheral vascular disease, unspecified: Secondary | ICD-10-CM | POA: Diagnosis not present

## 2022-08-30 DIAGNOSIS — L84 Corns and callosities: Secondary | ICD-10-CM | POA: Diagnosis not present

## 2022-08-30 DIAGNOSIS — E1151 Type 2 diabetes mellitus with diabetic peripheral angiopathy without gangrene: Secondary | ICD-10-CM | POA: Diagnosis not present

## 2022-08-30 DIAGNOSIS — L603 Nail dystrophy: Secondary | ICD-10-CM | POA: Diagnosis not present

## 2022-11-15 DIAGNOSIS — E1122 Type 2 diabetes mellitus with diabetic chronic kidney disease: Secondary | ICD-10-CM | POA: Diagnosis not present

## 2022-11-15 DIAGNOSIS — R809 Proteinuria, unspecified: Secondary | ICD-10-CM | POA: Diagnosis not present

## 2022-11-15 DIAGNOSIS — E669 Obesity, unspecified: Secondary | ICD-10-CM | POA: Diagnosis not present

## 2022-11-15 DIAGNOSIS — N200 Calculus of kidney: Secondary | ICD-10-CM | POA: Diagnosis not present

## 2022-11-15 DIAGNOSIS — N184 Chronic kidney disease, stage 4 (severe): Secondary | ICD-10-CM | POA: Diagnosis not present

## 2022-11-15 DIAGNOSIS — D631 Anemia in chronic kidney disease: Secondary | ICD-10-CM | POA: Diagnosis not present

## 2022-11-15 DIAGNOSIS — R609 Edema, unspecified: Secondary | ICD-10-CM | POA: Diagnosis not present

## 2022-11-15 DIAGNOSIS — N2581 Secondary hyperparathyroidism of renal origin: Secondary | ICD-10-CM | POA: Diagnosis not present

## 2022-11-15 DIAGNOSIS — I129 Hypertensive chronic kidney disease with stage 1 through stage 4 chronic kidney disease, or unspecified chronic kidney disease: Secondary | ICD-10-CM | POA: Diagnosis not present

## 2022-11-15 DIAGNOSIS — N400 Enlarged prostate without lower urinary tract symptoms: Secondary | ICD-10-CM | POA: Diagnosis not present

## 2023-01-17 DIAGNOSIS — S9032XA Contusion of left foot, initial encounter: Secondary | ICD-10-CM | POA: Diagnosis not present

## 2023-01-31 DIAGNOSIS — E559 Vitamin D deficiency, unspecified: Secondary | ICD-10-CM | POA: Diagnosis not present

## 2023-01-31 DIAGNOSIS — N184 Chronic kidney disease, stage 4 (severe): Secondary | ICD-10-CM | POA: Diagnosis not present

## 2023-01-31 DIAGNOSIS — Z0000 Encounter for general adult medical examination without abnormal findings: Secondary | ICD-10-CM | POA: Diagnosis not present

## 2023-01-31 DIAGNOSIS — E7800 Pure hypercholesterolemia, unspecified: Secondary | ICD-10-CM | POA: Diagnosis not present

## 2023-01-31 DIAGNOSIS — N2581 Secondary hyperparathyroidism of renal origin: Secondary | ICD-10-CM | POA: Diagnosis not present

## 2023-01-31 DIAGNOSIS — I6521 Occlusion and stenosis of right carotid artery: Secondary | ICD-10-CM | POA: Diagnosis not present

## 2023-01-31 DIAGNOSIS — E1122 Type 2 diabetes mellitus with diabetic chronic kidney disease: Secondary | ICD-10-CM | POA: Diagnosis not present

## 2023-01-31 DIAGNOSIS — D696 Thrombocytopenia, unspecified: Secondary | ICD-10-CM | POA: Diagnosis not present

## 2023-01-31 DIAGNOSIS — D692 Other nonthrombocytopenic purpura: Secondary | ICD-10-CM | POA: Diagnosis not present

## 2023-03-06 DIAGNOSIS — H019 Unspecified inflammation of eyelid: Secondary | ICD-10-CM | POA: Diagnosis not present

## 2023-03-06 DIAGNOSIS — L309 Dermatitis, unspecified: Secondary | ICD-10-CM | POA: Diagnosis not present

## 2023-03-21 DIAGNOSIS — L821 Other seborrheic keratosis: Secondary | ICD-10-CM | POA: Diagnosis not present

## 2023-03-21 DIAGNOSIS — D485 Neoplasm of uncertain behavior of skin: Secondary | ICD-10-CM | POA: Diagnosis not present

## 2023-03-21 DIAGNOSIS — D225 Melanocytic nevi of trunk: Secondary | ICD-10-CM | POA: Diagnosis not present

## 2023-03-21 DIAGNOSIS — L728 Other follicular cysts of the skin and subcutaneous tissue: Secondary | ICD-10-CM | POA: Diagnosis not present

## 2023-05-03 ENCOUNTER — Encounter (INDEPENDENT_AMBULATORY_CARE_PROVIDER_SITE_OTHER): Payer: No Typology Code available for payment source | Admitting: Ophthalmology

## 2023-05-03 DIAGNOSIS — H353112 Nonexudative age-related macular degeneration, right eye, intermediate dry stage: Secondary | ICD-10-CM

## 2023-05-03 DIAGNOSIS — H35343 Macular cyst, hole, or pseudohole, bilateral: Secondary | ICD-10-CM | POA: Diagnosis not present

## 2023-05-03 DIAGNOSIS — H43811 Vitreous degeneration, right eye: Secondary | ICD-10-CM

## 2023-06-14 DIAGNOSIS — N400 Enlarged prostate without lower urinary tract symptoms: Secondary | ICD-10-CM | POA: Diagnosis not present

## 2023-06-14 DIAGNOSIS — E1122 Type 2 diabetes mellitus with diabetic chronic kidney disease: Secondary | ICD-10-CM | POA: Diagnosis not present

## 2023-06-14 DIAGNOSIS — N189 Chronic kidney disease, unspecified: Secondary | ICD-10-CM | POA: Diagnosis not present

## 2023-06-14 DIAGNOSIS — D631 Anemia in chronic kidney disease: Secondary | ICD-10-CM | POA: Diagnosis not present

## 2023-06-14 DIAGNOSIS — E669 Obesity, unspecified: Secondary | ICD-10-CM | POA: Diagnosis not present

## 2023-06-14 DIAGNOSIS — N2581 Secondary hyperparathyroidism of renal origin: Secondary | ICD-10-CM | POA: Diagnosis not present

## 2023-06-14 DIAGNOSIS — N184 Chronic kidney disease, stage 4 (severe): Secondary | ICD-10-CM | POA: Diagnosis not present

## 2023-06-14 DIAGNOSIS — N200 Calculus of kidney: Secondary | ICD-10-CM | POA: Diagnosis not present

## 2023-06-14 DIAGNOSIS — I129 Hypertensive chronic kidney disease with stage 1 through stage 4 chronic kidney disease, or unspecified chronic kidney disease: Secondary | ICD-10-CM | POA: Diagnosis not present

## 2023-06-14 DIAGNOSIS — R809 Proteinuria, unspecified: Secondary | ICD-10-CM | POA: Diagnosis not present

## 2023-06-14 DIAGNOSIS — R609 Edema, unspecified: Secondary | ICD-10-CM | POA: Diagnosis not present

## 2023-07-26 DIAGNOSIS — F17211 Nicotine dependence, cigarettes, in remission: Secondary | ICD-10-CM | POA: Diagnosis not present

## 2023-07-26 DIAGNOSIS — Z008 Encounter for other general examination: Secondary | ICD-10-CM | POA: Diagnosis not present

## 2023-07-26 DIAGNOSIS — E669 Obesity, unspecified: Secondary | ICD-10-CM | POA: Diagnosis not present

## 2023-07-26 DIAGNOSIS — Z6830 Body mass index (BMI) 30.0-30.9, adult: Secondary | ICD-10-CM | POA: Diagnosis not present

## 2023-08-01 DIAGNOSIS — Z0100 Encounter for examination of eyes and vision without abnormal findings: Secondary | ICD-10-CM | POA: Diagnosis not present

## 2023-08-03 DIAGNOSIS — E7800 Pure hypercholesterolemia, unspecified: Secondary | ICD-10-CM | POA: Diagnosis not present

## 2023-08-03 DIAGNOSIS — J988 Other specified respiratory disorders: Secondary | ICD-10-CM | POA: Diagnosis not present

## 2023-08-03 DIAGNOSIS — D696 Thrombocytopenia, unspecified: Secondary | ICD-10-CM | POA: Diagnosis not present

## 2023-08-03 DIAGNOSIS — N184 Chronic kidney disease, stage 4 (severe): Secondary | ICD-10-CM | POA: Diagnosis not present

## 2023-08-03 DIAGNOSIS — E1122 Type 2 diabetes mellitus with diabetic chronic kidney disease: Secondary | ICD-10-CM | POA: Diagnosis not present

## 2023-10-04 DIAGNOSIS — N184 Chronic kidney disease, stage 4 (severe): Secondary | ICD-10-CM | POA: Diagnosis not present

## 2023-10-13 DIAGNOSIS — N2581 Secondary hyperparathyroidism of renal origin: Secondary | ICD-10-CM | POA: Diagnosis not present

## 2023-10-13 DIAGNOSIS — N200 Calculus of kidney: Secondary | ICD-10-CM | POA: Diagnosis not present

## 2023-10-13 DIAGNOSIS — N400 Enlarged prostate without lower urinary tract symptoms: Secondary | ICD-10-CM | POA: Diagnosis not present

## 2023-10-13 DIAGNOSIS — R609 Edema, unspecified: Secondary | ICD-10-CM | POA: Diagnosis not present

## 2023-10-13 DIAGNOSIS — N184 Chronic kidney disease, stage 4 (severe): Secondary | ICD-10-CM | POA: Diagnosis not present

## 2023-10-13 DIAGNOSIS — E1122 Type 2 diabetes mellitus with diabetic chronic kidney disease: Secondary | ICD-10-CM | POA: Diagnosis not present

## 2023-10-13 DIAGNOSIS — I129 Hypertensive chronic kidney disease with stage 1 through stage 4 chronic kidney disease, or unspecified chronic kidney disease: Secondary | ICD-10-CM | POA: Diagnosis not present

## 2023-10-13 DIAGNOSIS — D631 Anemia in chronic kidney disease: Secondary | ICD-10-CM | POA: Diagnosis not present

## 2023-10-13 DIAGNOSIS — N189 Chronic kidney disease, unspecified: Secondary | ICD-10-CM | POA: Diagnosis not present

## 2023-10-13 DIAGNOSIS — E669 Obesity, unspecified: Secondary | ICD-10-CM | POA: Diagnosis not present

## 2023-10-13 DIAGNOSIS — R809 Proteinuria, unspecified: Secondary | ICD-10-CM | POA: Diagnosis not present

## 2024-02-15 DIAGNOSIS — D696 Thrombocytopenia, unspecified: Secondary | ICD-10-CM | POA: Diagnosis not present

## 2024-02-15 DIAGNOSIS — N2581 Secondary hyperparathyroidism of renal origin: Secondary | ICD-10-CM | POA: Diagnosis not present

## 2024-02-15 DIAGNOSIS — N184 Chronic kidney disease, stage 4 (severe): Secondary | ICD-10-CM | POA: Diagnosis not present

## 2024-02-15 DIAGNOSIS — Z0000 Encounter for general adult medical examination without abnormal findings: Secondary | ICD-10-CM | POA: Diagnosis not present

## 2024-02-15 DIAGNOSIS — E1122 Type 2 diabetes mellitus with diabetic chronic kidney disease: Secondary | ICD-10-CM | POA: Diagnosis not present

## 2024-02-15 DIAGNOSIS — E7800 Pure hypercholesterolemia, unspecified: Secondary | ICD-10-CM | POA: Diagnosis not present

## 2024-02-15 DIAGNOSIS — E559 Vitamin D deficiency, unspecified: Secondary | ICD-10-CM | POA: Diagnosis not present

## 2024-02-15 DIAGNOSIS — Z23 Encounter for immunization: Secondary | ICD-10-CM | POA: Diagnosis not present

## 2024-02-15 DIAGNOSIS — I6521 Occlusion and stenosis of right carotid artery: Secondary | ICD-10-CM | POA: Diagnosis not present

## 2024-02-28 ENCOUNTER — Ambulatory Visit (INDEPENDENT_AMBULATORY_CARE_PROVIDER_SITE_OTHER): Admitting: Podiatry

## 2024-02-28 ENCOUNTER — Encounter: Payer: Self-pay | Admitting: Podiatry

## 2024-02-28 DIAGNOSIS — N184 Chronic kidney disease, stage 4 (severe): Secondary | ICD-10-CM | POA: Diagnosis not present

## 2024-02-28 DIAGNOSIS — B351 Tinea unguium: Secondary | ICD-10-CM | POA: Diagnosis not present

## 2024-02-28 DIAGNOSIS — M79674 Pain in right toe(s): Secondary | ICD-10-CM

## 2024-02-28 DIAGNOSIS — M79675 Pain in left toe(s): Secondary | ICD-10-CM | POA: Diagnosis not present

## 2024-02-28 DIAGNOSIS — E1122 Type 2 diabetes mellitus with diabetic chronic kidney disease: Secondary | ICD-10-CM | POA: Diagnosis not present

## 2024-02-28 NOTE — Progress Notes (Signed)
 This patient presents to the office with chief complaint of long thick nails and diabetic feet.  This patient  says there  is  no pain and discomfort in their feet.  This patient says there are long thick painful  big nails.  These nails are painful walking and wearing shoes.  Patient has no history of infection or drainage from both feet.  Patient is unable to  self treat his own nails . This patient presents  to the office today for treatment of the  long nails and a foot evaluation due to history of  diabetes.  General Appearance  Alert, conversant and in no acute stress.  Vascular  Dorsalis pedis and posterior tibial  pulses are palpable  bilaterally.  Capillary return is within normal limits  bilaterally. Temperature is within normal limits  bilaterally.  Neurologic  Senn-Weinstein monofilament wire test within normal limits /diminished   bilaterally. Muscle power within normal limits bilaterally.  Nails Thick disfigured discolored nails with subungual debris  from hallux to fifth toes bilaterally. No evidence of bacterial infection or drainage bilaterally.  Orthopedic  No limitations of motion of motion feet .  No crepitus or effusions noted.  No bony pathology or digital deformities noted.  Skin  normotropic skin with no porokeratosis noted bilaterally.  No signs of infections or ulcers noted.     Onychomycosis  Diabetes with no foot complications  IE  Debride nails x 10.  A diabetic foot exam was performed and there is no evidence of any vascular or neurologic pathology.   RTC 3 months.   Ruffin Cotton DPM

## 2024-04-01 DIAGNOSIS — N184 Chronic kidney disease, stage 4 (severe): Secondary | ICD-10-CM | POA: Diagnosis not present

## 2024-04-01 DIAGNOSIS — Z008 Encounter for other general examination: Secondary | ICD-10-CM | POA: Diagnosis not present

## 2024-04-01 DIAGNOSIS — E1122 Type 2 diabetes mellitus with diabetic chronic kidney disease: Secondary | ICD-10-CM | POA: Diagnosis not present

## 2024-04-01 DIAGNOSIS — E785 Hyperlipidemia, unspecified: Secondary | ICD-10-CM | POA: Diagnosis not present

## 2024-04-08 DIAGNOSIS — N184 Chronic kidney disease, stage 4 (severe): Secondary | ICD-10-CM | POA: Diagnosis not present

## 2024-04-17 DIAGNOSIS — N400 Enlarged prostate without lower urinary tract symptoms: Secondary | ICD-10-CM | POA: Diagnosis not present

## 2024-04-17 DIAGNOSIS — D631 Anemia in chronic kidney disease: Secondary | ICD-10-CM | POA: Diagnosis not present

## 2024-04-17 DIAGNOSIS — I129 Hypertensive chronic kidney disease with stage 1 through stage 4 chronic kidney disease, or unspecified chronic kidney disease: Secondary | ICD-10-CM | POA: Diagnosis not present

## 2024-04-17 DIAGNOSIS — E1122 Type 2 diabetes mellitus with diabetic chronic kidney disease: Secondary | ICD-10-CM | POA: Diagnosis not present

## 2024-04-17 DIAGNOSIS — R609 Edema, unspecified: Secondary | ICD-10-CM | POA: Diagnosis not present

## 2024-04-17 DIAGNOSIS — N2581 Secondary hyperparathyroidism of renal origin: Secondary | ICD-10-CM | POA: Diagnosis not present

## 2024-04-17 DIAGNOSIS — N184 Chronic kidney disease, stage 4 (severe): Secondary | ICD-10-CM | POA: Diagnosis not present

## 2024-04-17 DIAGNOSIS — N200 Calculus of kidney: Secondary | ICD-10-CM | POA: Diagnosis not present

## 2024-04-17 DIAGNOSIS — R809 Proteinuria, unspecified: Secondary | ICD-10-CM | POA: Diagnosis not present

## 2024-04-17 DIAGNOSIS — E669 Obesity, unspecified: Secondary | ICD-10-CM | POA: Diagnosis not present

## 2024-05-02 ENCOUNTER — Encounter (INDEPENDENT_AMBULATORY_CARE_PROVIDER_SITE_OTHER): Payer: PPO | Admitting: Ophthalmology

## 2024-05-02 DIAGNOSIS — H43811 Vitreous degeneration, right eye: Secondary | ICD-10-CM

## 2024-05-02 DIAGNOSIS — H353112 Nonexudative age-related macular degeneration, right eye, intermediate dry stage: Secondary | ICD-10-CM

## 2024-05-02 DIAGNOSIS — I6521 Occlusion and stenosis of right carotid artery: Secondary | ICD-10-CM | POA: Diagnosis not present

## 2024-05-02 DIAGNOSIS — H35343 Macular cyst, hole, or pseudohole, bilateral: Secondary | ICD-10-CM

## 2024-05-02 DIAGNOSIS — I6523 Occlusion and stenosis of bilateral carotid arteries: Secondary | ICD-10-CM | POA: Diagnosis not present

## 2024-05-13 DIAGNOSIS — Z008 Encounter for other general examination: Secondary | ICD-10-CM | POA: Diagnosis not present

## 2024-05-13 DIAGNOSIS — E1122 Type 2 diabetes mellitus with diabetic chronic kidney disease: Secondary | ICD-10-CM | POA: Diagnosis not present

## 2024-05-13 DIAGNOSIS — N184 Chronic kidney disease, stage 4 (severe): Secondary | ICD-10-CM | POA: Diagnosis not present

## 2024-05-13 DIAGNOSIS — E785 Hyperlipidemia, unspecified: Secondary | ICD-10-CM | POA: Diagnosis not present

## 2024-06-05 ENCOUNTER — Ambulatory Visit (INDEPENDENT_AMBULATORY_CARE_PROVIDER_SITE_OTHER): Admitting: Podiatry

## 2024-06-05 DIAGNOSIS — M79674 Pain in right toe(s): Secondary | ICD-10-CM

## 2024-06-05 DIAGNOSIS — B351 Tinea unguium: Secondary | ICD-10-CM

## 2024-06-05 DIAGNOSIS — N184 Chronic kidney disease, stage 4 (severe): Secondary | ICD-10-CM

## 2024-06-05 DIAGNOSIS — M79675 Pain in left toe(s): Secondary | ICD-10-CM | POA: Diagnosis not present

## 2024-06-05 DIAGNOSIS — E1122 Type 2 diabetes mellitus with diabetic chronic kidney disease: Secondary | ICD-10-CM | POA: Diagnosis not present

## 2024-06-05 NOTE — Progress Notes (Signed)
 This patient presents to the office for evaluation and treatment of his feet.  He is a diabetic.  Patient desires to discuss his nail due to his diabetes.  He presents for an evaluation and treatment.  Vascular  Dorsalis pedis and posterior tibial pulses are palpable  B/L.  Capillary return  WNL.  Temperature gradient is  WNL.  Skin turgor  WNL  Sensorium  Senn Weinstein monofilament wire  WNL. Normal tactile sensation.  Nail Exam  Patient has normal nails with thick big toenails and  with no evidence of bacterial or fungal infection.    Orthopedic  Exam  Muscle tone and muscle strength  WNL.  No limitations of motion feet  B/L.  No crepitus or joint effusion noted.  Foot type is unremarkable and digits show no abnormalities.  Bony prominences are unremarkable.  Skin  No open lesions.  Normal skin texture and turgor.   Onychomycosis Hallux  B/L.  Rov.  Discussed nail treatment with this patient.  Ian Rhodes DPM

## 2024-06-05 NOTE — Progress Notes (Signed)
 This patient presents to the office for evaluation and treatment of his feet.  He is a diabetic.  Patient desires to discuss his nail due to his diabetes.  He presents for an evaluation and treatment.  Vascular  Dorsalis pedis and posterior tibial pulses are palpable  B/L.  Capillary return  WNL.  Temperature gradient is  WNL.  Skin turgor  WNL  Sensorium  Senn Weinstein monofilament wire  WNL. Normal tactile sensation.  Nail Exam  Patient has normal nails with thick big toenails and  with no evidence of bacterial or fungal infection.    Orthopedic  Exam  Muscle tone and muscle strength  WNL.  No limitations of motion feet  B/L.  No crepitus or joint effusion noted.  Foot type is unremarkable and digits show no abnormalities.  Bony prominences are unremarkable.  Skin  No open lesions.  Normal skin texture and turgor.   Onychomycosis Hallux  B/L.  Rov.  Discussed nail treatment with this patient.  Cordella Bold DPM

## 2024-06-11 DIAGNOSIS — H02831 Dermatochalasis of right upper eyelid: Secondary | ICD-10-CM | POA: Diagnosis not present

## 2024-07-01 ENCOUNTER — Ambulatory Visit: Admitting: Podiatry

## 2024-07-08 DIAGNOSIS — N184 Chronic kidney disease, stage 4 (severe): Secondary | ICD-10-CM | POA: Diagnosis not present

## 2024-07-08 DIAGNOSIS — Z008 Encounter for other general examination: Secondary | ICD-10-CM | POA: Diagnosis not present

## 2024-07-08 DIAGNOSIS — E785 Hyperlipidemia, unspecified: Secondary | ICD-10-CM | POA: Diagnosis not present

## 2024-07-08 DIAGNOSIS — E1122 Type 2 diabetes mellitus with diabetic chronic kidney disease: Secondary | ICD-10-CM | POA: Diagnosis not present

## 2024-07-08 DIAGNOSIS — H01115 Allergic dermatitis of left lower eyelid: Secondary | ICD-10-CM | POA: Diagnosis not present

## 2024-08-14 DIAGNOSIS — N184 Chronic kidney disease, stage 4 (severe): Secondary | ICD-10-CM | POA: Diagnosis not present

## 2024-08-14 DIAGNOSIS — E7800 Pure hypercholesterolemia, unspecified: Secondary | ICD-10-CM | POA: Diagnosis not present

## 2024-08-14 DIAGNOSIS — E1122 Type 2 diabetes mellitus with diabetic chronic kidney disease: Secondary | ICD-10-CM | POA: Diagnosis not present

## 2024-09-05 ENCOUNTER — Ambulatory Visit (INDEPENDENT_AMBULATORY_CARE_PROVIDER_SITE_OTHER): Admitting: Podiatry

## 2024-09-05 ENCOUNTER — Encounter: Payer: Self-pay | Admitting: Podiatry

## 2024-09-05 DIAGNOSIS — N184 Chronic kidney disease, stage 4 (severe): Secondary | ICD-10-CM

## 2024-09-05 DIAGNOSIS — M79674 Pain in right toe(s): Secondary | ICD-10-CM

## 2024-09-05 DIAGNOSIS — B351 Tinea unguium: Secondary | ICD-10-CM

## 2024-09-05 DIAGNOSIS — E1122 Type 2 diabetes mellitus with diabetic chronic kidney disease: Secondary | ICD-10-CM

## 2024-09-05 DIAGNOSIS — M79675 Pain in left toe(s): Secondary | ICD-10-CM | POA: Diagnosis not present

## 2024-09-05 NOTE — Progress Notes (Signed)
 This patient presents to the office for evaluation and treatment of his feet.  He is a diabetic.  Patient desires to discuss his nail due to his diabetes.  He presents for an evaluation and treatment.  Vascular  Dorsalis pedis and posterior tibial pulses are palpable  B/L.  Capillary return  WNL.  Temperature gradient is  WNL.  Skin turgor  WNL  Sensorium  Senn Weinstein monofilament wire  WNL. Normal tactile sensation.  Nail Exam  Patient has normal nails with thick big toenails and  with no evidence of bacterial or fungal infection.    Orthopedic  Exam  Muscle tone and muscle strength  WNL.  No limitations of motion feet  B/L.  No crepitus or joint effusion noted.  Foot type is unremarkable and digits show no abnormalities.  Bony prominences are unremarkable.  Skin  No open lesions.  Normal skin texture and turgor.   Onychomycosis Hallux  B/L.   Debride hallux nails with nail nipper.  Discussed nail treatment with this patient.  Cordella Bold DPM

## 2024-11-14 ENCOUNTER — Ambulatory Visit: Attending: Cardiology | Admitting: Cardiology

## 2024-11-14 VITALS — BP 130/72 | HR 74 | Ht 71.0 in | Wt 218.0 lb

## 2024-11-14 DIAGNOSIS — I10 Essential (primary) hypertension: Secondary | ICD-10-CM | POA: Diagnosis not present

## 2024-11-14 DIAGNOSIS — Z86718 Personal history of other venous thrombosis and embolism: Secondary | ICD-10-CM | POA: Diagnosis not present

## 2024-11-14 DIAGNOSIS — E785 Hyperlipidemia, unspecified: Secondary | ICD-10-CM | POA: Diagnosis not present

## 2024-11-14 DIAGNOSIS — R002 Palpitations: Secondary | ICD-10-CM

## 2024-11-14 NOTE — Patient Instructions (Addendum)
 Medication Instructions:   No changes  *If you need a refill on your cardiac medications before your next appointment, please call your pharmacy*   Lab Work: Not needed    Testing/Procedures: Your physician has recommended that you wear an  non live event monitor  30 day . Event monitors are medical devices that record the hearts electrical activity. Doctors most often us  these monitors to diagnose arrhythmias. Arrhythmias are problems with the speed or rhythm of the heartbeat. The monitor is a small, portable device. You can wear one while you do your normal daily activities. This is usually used to diagnose what is causing palpitations/syncope (passing out).\   Follow-Up: At [facility], you and your health needs are our priority.  As part of our continuing mission to provide you with exceptional heart care, we have created designated Provider Care Teams.  These Care Teams include your primary Cardiologist (physician) and Advanced Practice Providers (APPs -  Physician Assistants and Nurse Practitioners) who all work together to provide you with the care you need, when you need it.  We recommend signing up for the patient portal called MyChart.  Sign up information is provided on this After Visit Summary.  MyChart is used to connect with patients for Virtual Visits (Telemedicine).  Patients are able to view lab/test results, encounter notes, upcoming appointments, etc.  Non-urgent messages can be sent to your provider as well.   To learn more about what you can do with MyChart, go to [email or web address].    Your next appointment:   As needed   The format for your next appointment:   In Person  Provider:   Wilbert Bihari, MD   Other Instructions    KardiaMobile [email or web address]  CanPurchase - [employer], [employer] or [employer], Sam's club      FDA-cleared, clinical grade mobile EKG monitor: Crist is the most  clinically-validated mobile EKG used by the world's leading cardiac care medical professionals With Basic service, know instantly if your heart rhythm is normal or if atrial fibrillation is detected, and email the last single EKG recording to yourself or your doctor Premium service, available for purchase through the [employer] app for $9.99 per month or $99 per year, includes unlimited history and storage of your EKG recordings, a monthly EKG summary report to share with your doctor, along with the ability to track your blood pressure, activity and weight Includes one KardiaMobile phone clip FREE SHIPPING: Standard delivery 1-3 business days. Orders placed by 11:00am PST will ship that afternoon. Otherwise, will ship next business day. All orders ship via [employer] from [place], [place]    [employer] - sending an EKG Download app and set up profile. Run EKG - by placing 1-2 fingers on the silver plates After EKG is complete - Download PDF  - Skip password (if you apply a password the provider will need it to view the EKG) Click share button (square with upward arrow) in bottom left corner To send: choose MyChart (first time log into MyChart)  Pop up window about sending ECG Click continue Choose type of message Choose provider Type subject and message Click send (EKG should be attached)  - To send additional EKGs in one message click the paperclip image and bottom of page to attach.    .    [employer] Cardiac Event Monitor Instructions  Your physician has requested you wear your cardiac event monitor for __30___ days, (1-30). [employer] may call or text to confirm a shipping address.  The monitor will be sent to a land address via UPS. [employer] will not ship a monitor to a PO BOX. It typically takes 3-5 days to receive your monitor after it has been enrolled. [employer] will assist with USPS tracking if your package is delayed. The telephone number for [employer] is  [phone]. Once you have received your monitor, please review the enclosed instructions. Instruction tutorials can also be viewed under help and settings on the enclosed cell phone. Your monitor has already been registered assigning a specific monitor serial # to you.  Billing and Self Pay Discount Information  [employer] has been provided the insurance information we had on file for you.  If your insurance has been updated, please call [employer] at [phone] to provide them with your updated insurance information.   [employer] offers a discounted Self Pay option for patients who have insurance that does not cover their cardiac event monitor or patients without insurance.  The discounted cost of a Self Pay Cardiac Event Monitor would be $225.00 , if the patient contacts [employer] at [phone] within 7 days of applying the monitor to make payment arrangements.  If the patient does not contact [employer] within 7 days of applying the monitor, the cost of the cardiac event monitor will be $350.00.  Applying the monitor  Remove cell phone from case and turn it on. The cell phone works as it consultant and needs to be within [employer] of you at all times. The cell phone will need to be charged on a daily basis. We recommend you plug the cell phone into the enclosed charger at your bedside table every night.  Monitor batteries: You will receive two monitor batteries labelled #1 and #2. These are your recorders. Plug battery #2 onto the second connection on the enclosed charger. Keep one battery on the charger at all times. This will keep the monitor battery deactivated. It will also keep it fully charged for when you need to switch your monitor batteries. A small light will be blinking on the battery emblem when it is charging. The light on the battery emblem will remain on when the battery is fully charged.  Open package of a Monitor strip. Insert battery #1 into black hood on strip  and gently squeeze monitor battery onto connection as indicated in instruction booklet. Set aside while preparing skin.  Choose location for your strip, vertical or horizontal, as indicated in the instruction booklet. Shave to remove all hair from location. There cannot be any lotions, oils, powders, or colognes on skin where monitor is to be applied. Wipe skin clean with enclosed Saline wipe. Dry skin completely.  Peel paper labeled #1 off the back of the Monitor strip exposing the adhesive. Place the monitor on the chest in the vertical or horizontal position shown in the instruction booklet. One arrow on the monitor strip must be pointing upward. Carefully remove paper labeled #2, attaching remainder of strip to your skin. Try not to create any folds or wrinkles in the strip as you apply it.  Firmly press and release the circle in the center of the monitor battery. You will hear a small beep. This is turning the monitor battery on. The heart emblem on the monitor battery will light up every 5 seconds if the monitor battery in turned on and connected to the patient securely. Do not push and hold the circle down as this turns the monitor battery off. The cell phone will locate the monitor battery. A screen will  appear on the cell phone checking the connection of your monitor strip. This may read poor connection initially but change to good connection within the next minute. Once your monitor accepts the connection you will hear a series of 3 beeps followed by a climbing crescendo of beeps. A screen will appear on the cell phone showing the two monitor strip placement options. Touch the picture that demonstrates where you applied the monitor strip.  Your monitor strip and battery are waterproof. You are able to shower, bathe, or swim with the monitor on. They just ask you do not submerge deeper than 3 feet underwater. We recommend removing the monitor if you are swimming in a lake, river, or  ocean.  Your monitor battery will need to be switched to a fully charged monitor battery approximately once a week. The cell phone will alert you of an action which needs to be made.  On the cell phone, tap for details to reveal connection status, monitor battery status, and cell phone battery status. The green dots indicates your monitor is in good status. A red dot indicates there is something that needs your attention.  To record a symptom, click the circle on the monitor battery. In 30-60 seconds a list of symptoms will appear on the cell phone. Select your symptom and tap save. Your monitor will record a sustained or significant arrhythmia regardless of you clicking the button. Some patients do not feel the heart rhythm irregularities. [employer] will notify us  of any serious or critical events.  Refer to instruction booklet for instructions on switching batteries, changing strips, the Do not disturb or Pause features, or any additional questions.  Call [employer] at [phone], to confirm your monitor is transmitting and record your baseline. They will answer any questions you may have regarding the monitor instructions at that time.  Returning the monitor to [employer]  Place all equipment back into blue box. Peel off strip of paper to expose adhesive and close box securely. There is a prepaid UPS shipping label on this box. Drop in a UPS drop box, or at a UPS facility like [employer]. You may also contact [employer] to arrange UPS to pick up monitor package at your home.

## 2024-11-14 NOTE — Progress Notes (Signed)
 "    Cardiology CONSULT Note    Date:  11/14/2024   ID:  Ian Rhodes, DOB 03/17/1936, MRN [id number]  PCP:  Kip Righter, MD  Cardiologist:  Wilbert Bihari, MD   Chief Complaint  Patient presents with   New Patient (Initial Visit)    Palpitations    Patient Profile: Ian Rhodes is a 89 y.o. male who is being seen today for the evaluation of palpitations at the request of Kip Righter, MD.  History of Present Illness:  Ian Rhodes is a 89 y.o. male who is being seen today for the evaluation of palpitations at the request of Kip Righter, MD.  This is an 89 year old male with a history of type 2 diabetes, DVT following leg surgery, hyperlipidemia, nephrolithiasis and carotid artery stenosis.  I saw him back in 2015 with atypical chest pain and nuclear stress test showed no ischemia and 2D echo showed normal LV function.  He has known carotid artery stenosis with carotid Dopplers in 2023 showing minor carotid atherosclerosis with no stenosis.  He is doing well today.  He was seen by his PCP on 11/01/2024 and was complaining of palpitations.  There was concern this could be paroxysmal atrial fibrillation. He tells me that he feels them mainly when at rest.  It has only occurred a few times. It lasts a few minutes and then resolves. There is no pain and just has a strange feeling in his chest and feels like it is beating.  He has not had any dizziness with the palpitations but has been having problems with vertigo.  He denies any chest pain or pressure, SOB, DOE, PND, orthopnea,  dizziness or syncope. Occasionally will have some mild LE edema if he sits too long with his legs hanging down.  Past Medical History:  Diagnosis Date   Arthritis    BPH (benign prostatic hypertrophy)    Carotid artery stenosis    Colon polyps    Diabetes mellitus    Type II   DVT (deep venous thrombosis) (HCC) 1995   r leg following surgery    ED (erectile dysfunction)    Hyperlipemia     Kidney stones    Macular degeneration    Obesity    Sleep apnea    very  mild not on CPAP    Past Surgical History:  Procedure Laterality Date   EYE SURGERY     two surgeries   KNEE ARTHROSCOPY, MEDIAL PATELLO FEMORAL LIGAMENT RECONSTRUCTION W/ HAMSTRING GRAFT     bilateral   REVERSE SHOULDER ARTHROPLASTY Left 09/01/2020   Procedure: REVERSE SHOULDER ARTHROPLASTY;  Surgeon: Josefina Chew, MD;  Location: [facility];  Service: Orthopedics;  Laterality: Left;   SPINAL FUSION     TOTAL HIP ARTHROPLASTY  09/21/2011   Procedure: TOTAL HIP ARTHROPLASTY;  Surgeon: Jerona LULLA Sage, MD;  Location: [facility];  Service: Orthopedics;  Laterality: Right;  Right Total Hip Arthroplasty    Current Medications: Active Medications[1]  Allergies:   Patient has no known allergies.   Social History   Socioeconomic History   Marital status: Married    Spouse name: Not on file   Number of children: Not on file   Years of education: Not on file   Highest education level: Not on file  Occupational History   Not on file  Tobacco Use   Smoking status: Former   Smokeless tobacco: Former    Quit date: 06/13/1994  Vaping Use  Vaping status: Never Used  Substance and Sexual Activity   Alcohol use: No   Drug use: No   Sexual activity: Not on file  Other Topics Concern   Not on file  Social History Narrative   Not on file   Social Drivers of Health   Tobacco Use: Medium Risk (09/05/2024)   Patient History    Smoking Tobacco Use: Former    Smokeless Tobacco Use: Former    Passive Exposure: Not on Stage Manager: Not on [employer] Insecurity: Not on file  Transportation Needs: Not on file  Physical Activity: Not on file  Stress: Not on file  Social Connections: Not on file  Depression (PHQ2-9): Not on file  Alcohol Screen: Not on file  Housing: Not on file  Utilities: Not on file  Health Literacy: Not on file     Family History:  The patient's family history includes CAD  in his father; Heart attack in his father.   ROS:   Please see the history of present illness.    ROS All other systems reviewed and are negative.      No data to display             PHYSICAL EXAM:   VS:  BP 130/72 (BP Location: Left Arm, Patient Position: Sitting, Cuff Size: Normal)   Pulse 74   Ht 5' 11 (1.803 m)   Wt 218 lb (98.9 kg)   SpO2 98%   BMI 30.40 kg/m    GEN: Well nourished, well developed, in no acute distress  HEENT: normal  Neck: no JVD, carotid bruits, or masses Cardiac: RRR; no murmurs, rubs, or gallops,no edema.  Intact distal pulses bilaterally.  Respiratory:  clear to auscultation bilaterally, normal work of breathing GI: soft, nontender, nondistended, + BS MS: no deformity or atrophy  Skin: warm and dry, no rash Neuro:  Alert and Oriented x 3, Strength and sensation are intact Psych: euthymic mood, full affect  Wt Readings from Last 3 Encounters:  11/14/24 218 lb (98.9 kg)  09/01/20 228 lb 2 oz (103.5 kg)  08/20/20 228 lb 2 oz (103.5 kg)      Studies/Labs Reviewed:   EKG Interpretation Date/Time:  Thursday November 14 2024 08:36:09 EST Ventricular Rate:  74 PR Interval:  162 QRS Duration:  78 QT Interval:  376 QTC Calculation: 417 R Axis:   -14  Text Interpretation: Normal sinus rhythm Normal ECG When compared with ECG of 14-Sep-2011 09:59, No significant change was found Confirmed by Shlomo Corning [phone]) on 11/14/2024 8:47:41 AM   EKG Interpretation Date/Time:  Thursday November 14 2024 08:36:09 EST Ventricular Rate:  74 PR Interval:  162 QRS Duration:  78 QT Interval:  376 QTC Calculation: 417 R Axis:   -14  Text Interpretation: Normal sinus rhythm Normal ECG When compared with ECG of 14-Sep-2011 09:59, No significant change was found Confirmed by Shlomo Corning ([id number]) on 11/14/2024 8:47:41 AM     Recent Labs: No results found for requested labs within last 365 days.   Lipid Panel No results found for: CHOL, TRIG, HDL,  CHOLHDL, VLDL, LDLCALC, LDLDIRECT    ASSESSMENT:    1. Palpitations   2. Hyperlipidemia, unspecified hyperlipidemia type   3. History of DVT (deep vein thrombosis)   4. Benign essential HTN      PLAN:  In order of problems listed above:  Palpitations - Will get a 30 day event monitor to assess for A-fib or other arrhythmias -  I have recommended that he get a [employer] device  Hyperlipidemia - LDL goal< 70 due to atherosclerosis of the carotid arteries without stenosis -I have personally reviewed and interpreted outside labs performed by patient's PCP which showed LDL 48 and HDL 40 on 01/26/2024 - Continue pravastatin  80 mg daily with as needed refills  History of DVT - On chronic anticoagulation with Xarelto  10 mg daily  Hypertension CKD stage 4 - BP controlled on exam today at 130/72 mmHg - Continue Zestril 5 mg daily with as needed refills - I have personally reviewed and interpreted outside labs performed by patient's PCP which showed serum creatinine 2.35 on 10/01/2024 and potassium 4.1 on 08/14/2024  Time Spent: 30 minutes total time of encounter, including 15 minutes spent in face-to-face patient care on the date of this encounter. This time includes coordination of care and counseling regarding above mentioned problem list. Remainder of non-face-to-face time involved reviewing chart documents/testing relevant to the patient encounter and documentation in the medical record. I have independently reviewed documentation from referring provider  Followup:  PRN  Medication Adjustments/Labs and Tests Ordered: Current medicines are reviewed at length with the patient today.  Concerns regarding medicines are outlined above.  Medication changes, Labs and Tests ordered today are listed in the Patient Instructions below.  There are no Patient Instructions on file for this visit.   Signed, Wilbert Bihari, MD  11/14/2024 8:51 AM    [facility]  [facility] [address] [place], [place], [place]  [address] Phone: [phone]; Fax: [phone]      [1]  Current Meds  Medication Sig   baclofen  (LIORESAL ) 10 MG tablet Take 1 tablet (10 mg total) by mouth 3 (three) times daily. As needed for muscle spasm   calcitRIOL  (ROCALTROL ) 0.25 MCG capsule Take 0.25 mcg by mouth daily.   cholecalciferol  (VITAMIN D3) 25 MCG (1000 UNIT) tablet Take 1,000 Units by mouth daily.   fluticasone (FLONASE) 50 MCG/ACT nasal spray Place 1 spray into both nostrils daily as needed for allergies or rhinitis.   glipiZIDE (GLUCOTROL XL) 5 MG 24 hr tablet Take 5 mg by mouth every morning.   HYDROcodone -acetaminophen  (NORCO) 5-325 MG tablet Take 1-2 tablets by mouth every 6 (six) hours as needed for moderate pain. MAXIMUM TOTAL ACETAMINOPHEN  DOSE IS 4000 MG PER DAY   lisinopril (ZESTRIL) 5 MG tablet Take 5 mg by mouth at bedtime.   Multiple Vitamins-Minerals (PRESERVISION AREDS 2 PO) Take 1 capsule by mouth in the morning and at bedtime.   ondansetron  (ZOFRAN ) 4 MG tablet Take 1 tablet (4 mg total) by mouth every 8 (eight) hours as needed for nausea or vomiting.   potassium citrate (UROCIT-K) 10 MEQ (1080 MG) SR tablet Take 20 mEq by mouth daily.   pravastatin  (PRAVACHOL ) 80 MG tablet Take 80 mg by mouth daily.   rivaroxaban  (XARELTO ) 10 MG TABS tablet Take 1 tablet (10 mg total) by mouth daily.   sennosides-docusate sodium  (SENOKOT-S) 8.6-50 MG tablet Take 2 tablets by mouth daily.   triamcinolone  (NASACORT ) 55 MCG/ACT AERO nasal inhaler Place 2 sprays into the nose daily. 2 sprays each nostril at night on a regular basis   vitamin B-12 (CYANOCOBALAMIN ) 100 MCG tablet Take 100 mcg by mouth daily.   vitamin C (ASCORBIC ACID ) 250 MG tablet Take 250 mg by mouth daily.   "

## 2024-11-26 ENCOUNTER — Institutional Professional Consult (permissible substitution) (INDEPENDENT_AMBULATORY_CARE_PROVIDER_SITE_OTHER)

## 2024-11-28 ENCOUNTER — Encounter (INDEPENDENT_AMBULATORY_CARE_PROVIDER_SITE_OTHER): Payer: Self-pay | Admitting: Otolaryngology

## 2024-11-28 ENCOUNTER — Ambulatory Visit (INDEPENDENT_AMBULATORY_CARE_PROVIDER_SITE_OTHER): Admitting: Otolaryngology

## 2024-11-28 VITALS — BP 126/75 | HR 66 | Ht 71.0 in | Wt 220.0 lb

## 2024-11-28 DIAGNOSIS — R42 Dizziness and giddiness: Secondary | ICD-10-CM

## 2024-11-28 NOTE — Progress Notes (Signed)
 Reason for Consult: Dizziness Referring Physician: Dr. Spencer Carlin Ian Rhodes is an 89 y.o. male.  HPI: Patient with dizziness for several months.  He states it only occurs when he gets up out of bed and starts walking down the hallway.  It is not really vertigo it is a lightheaded feeling that goes away in less than a minute.  He does not have it in any other timeframe of the day according to his history.  No hearing loss with the episode.  No tinnitus that is new but he has bilateral chronic tinnitus.  No ear infections or drainage.  No headaches or nasal obstruction.  Past Medical History:  Diagnosis Date   Arthritis    BPH (benign prostatic hypertrophy)    Carotid artery stenosis    Colon polyps    Diabetes mellitus    Type II   DVT (deep venous thrombosis) (HCC) 1995   r leg following surgery    ED (erectile dysfunction)    Hyperlipemia    Kidney stones    Macular degeneration    Obesity    Sleep apnea    very  mild not on CPAP    Past Surgical History:  Procedure Laterality Date   EYE SURGERY     two surgeries   KNEE ARTHROSCOPY, MEDIAL PATELLO FEMORAL LIGAMENT RECONSTRUCTION W/ HAMSTRING GRAFT     bilateral   REVERSE SHOULDER ARTHROPLASTY Left 09/01/2020   Procedure: REVERSE SHOULDER ARTHROPLASTY;  Surgeon: Josefina Chew, MD;  Location: [facility];  Service: Orthopedics;  Laterality: Left;   SPINAL FUSION     TOTAL HIP ARTHROPLASTY  09/21/2011   Procedure: TOTAL HIP ARTHROPLASTY;  Surgeon: Jerona LULLA Sage, MD;  Location: [facility];  Service: Orthopedics;  Laterality: Right;  Right Total Hip Arthroplasty    Family History  Problem Relation Age of Onset   Heart attack Father    CAD Father     Social History:  reports that he has quit smoking. He quit smokeless tobacco use about 30 years ago. He reports that he does not drink alcohol and does not use drugs.  Allergies: Allergies[1]   No results found for this or any previous visit (from the past 48 hours).  No  results found.  ROS Blood pressure 126/75, pulse 66, height 5' 11 (1.803 m), weight 220 lb (99.8 kg), SpO2 97%. Physical Exam Constitutional:      Appearance: Normal appearance.  HENT:     Head: Normocephalic and atraumatic.     Right Ear: Tympanic membrane is without lesions and middle ear aerated, ear canal and external ear normal.     Left Ear: Tympanic membrane is without lesions and middle ear aerated, ear canal and external ear normal.     Nose: Nose without deviation of septum.  Turbinates with mild hypertrophy, No significant swelling or masses.     Oral cavity/oropharynx: Mucous membranes are moist. No lesions or masses    Larynx: normal voice. Mirror attempted without success    Eyes:     Extraocular Movements: Extraocular movements intact.     Conjunctiva/sclera: Conjunctivae normal.     Pupils: Pupils are equal, round, and reactive to light.  Cardiovascular:     Rate and Rhythm: Normal rate.  Pulmonary:     Effort: Pulmonary effort is normal.  Musculoskeletal:     Cervical back: Normal range of motion and neck supple. No rigidity.  Lymphadenopathy:     Cervical: No cervical adenopathy or masses.salivary glands without lesions. SABRA  Salivary glands- no mass or swelling Neurological:     Mental Status: He is alert. CN 2-12 intact. No nystagmus      Assessment/Plan: Dizziness-this does not sound like an ear problem as he is basically lightheaded and his dizziness seems to be orthostatically related and goes away in less than a minute.  He needs to go back to his primary care doctor for workup and evaluation of this problem.  Norleen Notice 11/28/2024, 12:42 PM        [1] No Known Allergies

## 2024-12-06 ENCOUNTER — Ambulatory Visit: Admitting: Podiatry

## 2025-05-01 ENCOUNTER — Encounter (INDEPENDENT_AMBULATORY_CARE_PROVIDER_SITE_OTHER): Admitting: Ophthalmology
